# Patient Record
Sex: Female | Born: 1965 | Race: Asian | Hispanic: No | Smoking: Never smoker
Health system: Southern US, Community
[De-identification: ages and names within clinical notes are randomized; demographics above are authoritative.]

## PROBLEM LIST (undated history)

## (undated) DIAGNOSIS — C50919 Malignant neoplasm of unspecified site of unspecified female breast: Secondary | ICD-10-CM

## (undated) DIAGNOSIS — K259 Gastric ulcer, unspecified as acute or chronic, without hemorrhage or perforation: Principal | ICD-10-CM

## (undated) HISTORY — DX: Gastric ulcer, unspecified as acute or chronic, without hemorrhage or perforation: K25.9

## (undated) HISTORY — DX: Malignant neoplasm of unspecified site of unspecified female breast: C50.919

## (undated) HISTORY — PX: BREAST SURGERY: SHX581

---

## 2010-06-18 DIAGNOSIS — C50919 Malignant neoplasm of unspecified site of unspecified female breast: Secondary | ICD-10-CM

## 2010-06-18 HISTORY — PX: MASTECTOMY WITH AXILLARY LYMPH NODE DISSECTION: SHX5661

## 2010-06-18 HISTORY — DX: Malignant neoplasm of unspecified site of unspecified female breast: C50.919

## 2012-06-13 ENCOUNTER — Encounter: Payer: Self-pay | Admitting: *Deleted

## 2012-06-13 NOTE — Progress Notes (Signed)
Son came into today w/ paperwork to establish care for this mom since they have moved to the states.  I confirmed 07/04/12 appt w/ him.  Gave before appt letter & packet to him.  Took him to [facility] to get set up with some assistance.

## 2012-06-26 ENCOUNTER — Other Ambulatory Visit: Payer: Self-pay | Admitting: *Deleted

## 2012-06-26 DIAGNOSIS — C50911 Malignant neoplasm of unspecified site of right female breast: Secondary | ICD-10-CM | POA: Insufficient documentation

## 2012-06-26 DIAGNOSIS — C50919 Malignant neoplasm of unspecified site of unspecified female breast: Secondary | ICD-10-CM

## 2012-07-04 ENCOUNTER — Encounter: Payer: Self-pay | Admitting: Oncology

## 2012-07-04 ENCOUNTER — Other Ambulatory Visit (HOSPITAL_BASED_OUTPATIENT_CLINIC_OR_DEPARTMENT_OTHER): Payer: Self-pay | Admitting: Lab

## 2012-07-04 ENCOUNTER — Encounter (HOSPITAL_COMMUNITY): Payer: Self-pay | Admitting: *Deleted

## 2012-07-04 ENCOUNTER — Emergency Department (INDEPENDENT_AMBULATORY_CARE_PROVIDER_SITE_OTHER)
Admission: EM | Admit: 2012-07-04 | Discharge: 2012-07-04 | Disposition: A | Discharge status: Home / Self Care | Payer: Self-pay | Source: Home / Self Care | Attending: Emergency Medicine | Admitting: Emergency Medicine

## 2012-07-04 ENCOUNTER — Telehealth: Payer: Self-pay | Admitting: Oncology

## 2012-07-04 ENCOUNTER — Ambulatory Visit (HOSPITAL_BASED_OUTPATIENT_CLINIC_OR_DEPARTMENT_OTHER): Payer: Self-pay | Admitting: Oncology

## 2012-07-04 ENCOUNTER — Ambulatory Visit: Payer: Self-pay

## 2012-07-04 VITALS — BP 161/93 | HR 83 | Temp 98.1°F | Resp 20 | Ht 60.5 in | Wt 167.1 lb

## 2012-07-04 DIAGNOSIS — C50919 Malignant neoplasm of unspecified site of unspecified female breast: Secondary | ICD-10-CM

## 2012-07-04 DIAGNOSIS — L290 Pruritus ani: Secondary | ICD-10-CM

## 2012-07-04 DIAGNOSIS — Z170 Estrogen receptor positive status [ER+]: Secondary | ICD-10-CM

## 2012-07-04 DIAGNOSIS — K259 Gastric ulcer, unspecified as acute or chronic, without hemorrhage or perforation: Secondary | ICD-10-CM

## 2012-07-04 DIAGNOSIS — Z9010 Acquired absence of unspecified breast and nipple: Secondary | ICD-10-CM

## 2012-07-04 HISTORY — DX: Gastric ulcer, unspecified as acute or chronic, without hemorrhage or perforation: K25.9

## 2012-07-04 LAB — CBC WITH DIFFERENTIAL/PLATELET
BASO%: 0.6 % (ref 0.0–2.0)
Basophils Absolute: 0 10*3/uL (ref 0.0–0.1)
Eosinophils Absolute: 0.1 10*3/uL (ref 0.0–0.5)
HCT: 36.1 % (ref 34.8–46.6)
LYMPH%: 40.9 % (ref 14.0–49.7)
MCHC: 34.8 g/dL (ref 31.5–36.0)
MONO#: 0.5 10*3/uL (ref 0.1–0.9)
NEUT#: 2.1 10*3/uL (ref 1.5–6.5)
NEUT%: 44.3 % (ref 38.4–76.8)
Platelets: 153 10*3/uL (ref 145–400)
RBC: 4.09 10*6/uL (ref 3.70–5.45)
RDW: 12.3 % (ref 11.2–14.5)
WBC: 4.8 10*3/uL (ref 3.9–10.3)
lymph#: 2 10*3/uL (ref 0.9–3.3)

## 2012-07-04 LAB — COMPREHENSIVE METABOLIC PANEL (CC13)
ALT: 17 U/L (ref 0–55)
CO2: 26 mEq/L (ref 22–29)
Calcium: 9.3 mg/dL (ref 8.4–10.4)
Chloride: 107 mEq/L (ref 98–107)
Creatinine: 0.8 mg/dL (ref 0.6–1.1)
Glucose: 112 mg/dl — ABNORMAL HIGH (ref 70–99)
Potassium: 4 mEq/L (ref 3.5–5.1)
Sodium: 142 mEq/L (ref 136–145)
Total Protein: 7 g/dL (ref 6.4–8.3)

## 2012-07-04 LAB — CANCER ANTIGEN 27.29: CA 27.29: 29 U/mL (ref 0–39)

## 2012-07-04 MED ORDER — NYSTATIN 100000 UNIT/GM EX CREA: TOPICAL_CREAM | CUTANEOUS | Status: DC

## 2012-07-04 MED ORDER — HYDROCORTISONE 1 % EX CREA: TOPICAL_CREAM | CUTANEOUS | Status: DC

## 2012-07-04 NOTE — Progress Notes (Signed)
Checked in new pt with no insurance.  Pt is not a [place] citizen so is unable to apply for Medicaid.  Pt has turned in her completed EPP application so I will process it today and give the approval letter and green card to her after her appointment today.

## 2012-07-04 NOTE — Progress Notes (Signed)
Pt is approved for 100% financial assistance effective 07/04/12 - 01/01/13.  I will give her the approval letter and green card today after her appointment.

## 2012-07-04 NOTE — Progress Notes (Signed)
Angela [name] [id number] 07-08-1965 47 y.o. 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 Patient is establishing her oncologic care.  STAGE:  Stage III ( Invasive Ductal Carcinoma, ER+ Her2Neu+)  Right breast s/p mastectomy with ALND, 5.3 cm  REFERRING PHYSICIAN: Self referred  HISTORY OF PRESENT ILLNESS:  Angela [name] is a 47 y.o. female.  Was originally from [place]. Patient had her annual mammogram performed in November 2012 and she felt a mass. In 05/08/2011 patient underwent a right modified radical mastectomy with level II axillary clearance. Her final pathology revealed a invasive ductal carcinoma that was grade 3 measuring 5.3 cm in greatest dimension. All margins were negative. She was noted to have level lymphovascular invasion. Patient had 22 lymph nodes removed one was positive for metastatic disease without extracapsular extension.. Patient went on to receive adjuvant chemotherapy consisting of 6 cycles of Taxotere Adriamycin and Cytoxan. The tumor was ER positive PR positive HER-2/neu positive.She was then placed on tamoxifen 20 mg on a daily basis. She tolerated this quite well. She subsequently relocated to the [place] to be closer to her son.she is tolerating tamoxifen well without any complaints. She denies any nausea vomiting fevers chills night sweats headaches shortness of breath chest pains palpitations she has no myalgias and arthralgias. Remainder of the 14 point review of systems is negative.   Past Medical History: Past Medical History  Diagnosis Date  . Breast cancer 06/18/2010    stage II  . Stomach ulcer 07/04/2012    Past Surgical History: Past Surgical History  Procedure Date  . Mastectomy with axillary lymph node dissection 06/18/2010    T3N1    Family History: Family History  Problem Relation Age of Onset  .  Cancer Cousin 30    breast cancer    Social History History  Substance Use Topics  . Smoking status: Never Smoker   . Smokeless tobacco: Never Used  . Alcohol Use: No    Allergies: No Known Allergies  Current Medications: Current Outpatient Prescriptions  Medication Sig Dispense Refill  . Multiple Vitamins-Minerals (MULTIVITAMIN PO) Take 1 tablet by mouth daily.      . tamoxifen (NOLVADEX) 10 MG tablet Take 10 mg by mouth 2 (two) times daily.      . NON FORMULARY Risek 40mg  as needed for pain        OB/GYN History: menarche at 74, G2P1, menopause at 52, HRT,   Fertility Discussion: N/A Prior History of Cancer: N/A  Health Maintenance:  Colonoscopy none Bone Density none  Last PAP smear many years  ECOG PERFORMANCE STATUS: 0 - Asymptomatic  Genetic Counseling/testing: refer to genetic counseling and testing  REVIEW OF SYSTEMS:  A comprehensive review of systems was negative.  PHYSICAL EXAMINATION: Blood pressure 161/93, pulse 83, temperature 98.1 F (36.7 C), temperature source Oral, resp. rate 20, height 5' 0.5" (1.537 m), weight 167 lb 1.6 oz (75.796 kg).  NWG:NFAOZ, healthy, no distress, well nourished and well developed SKIN: skin color, texture, turgor are normal HEAD: Normocephalic EYES: PERRLA, EOMI EARS: External ears normal OROPHARYNX:no exudate and no erythema  NECK: supple, no adenopathy LYMPH:  no palpable lymphadenopathy, no hepatosplenomegaly BREAST:left breast normal without mass, skin or nipple changes or axillary nodes, surgical scars noted in the right mastectomy without any evidence of local recurrence. LUNGS: clear to auscultation  HEART: regular rate & rhythm ABDOMEN:abdomen soft, non-tender, normal bowel  sounds and no masses or organomegaly BACK: Back symmetric, no curvature., No CVA tenderness EXTREMITIES:no edema, no clubbing, no cyanosis  NEURO: alert & oriented x 3 with fluent speech, no focal motor/sensory deficits, gait  normal  STUDIES/RESULTS: No results found.  LABS:    Chemistry      Component Value Date/Time   NA 142 07/04/2012 0903   K 4.0 07/04/2012 0903   CL 107 07/04/2012 0903   CO2 26 07/04/2012 0903   BUN 13.2 07/04/2012 0903   CREATININE 0.8 07/04/2012 0903      Component Value Date/Time   CALCIUM 9.3 07/04/2012 0903   ALKPHOS 90 07/04/2012 0903   AST 18 07/04/2012 0903   ALT 17 07/04/2012 0903   BILITOT 0.43 07/04/2012 0903      Lab Results  Component Value Date   WBC 4.8 07/04/2012   HGB 12.6 07/04/2012   HCT 36.1 07/04/2012   MCV 88.3 07/04/2012   PLT 153 07/04/2012   PATHOLOGY: Invasive ductal carcinoma right breast 5.3 cm, ER+, PR+, Her2Neu +, grade III T3N2 (2/23)  ASSESSMENT    47 year old female with   #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 Her final pathology revealed a 5.3 cm ER positive PR positive HER-2/neu positive invasive ductal carcinoma. One out of 22 lymph nodes were positive for metastatic disease.  #2 patient went on to receive adjuvant chemotherapy consisting of 6 cycles of Taxotere Adriamycin and Cytoxan. Which she tolerated well overall.  #3 after completing the chemotherapy she started tamoxifen 20 mg daily she remains on this she is tolerating it well without any problems.  Clinical Trial Eligibility: no  Multidisciplinary conference discussion no      PLAN:    #1 patient will continue to get yearly mammograms on the left breast.  #2 we discussed genetics and the need for genetic counseling and testing and I will refer her to the genetic counselor.  #3 patient may eventually be able to switch to an aromatase inhibitor however we will plan on doing hormone levels on her next visit to demonstrate whether she is postmenopausal or not.  #4 patient will be seen back in about 3-6 months time for followup.        Discussion: Patient is being treated  per NCCN breast cancer care guidelines appropriate for stage III   Thank you so much for allowing me to participate in the care of Angela [name]. I will continue to follow up the patient with you and assist in her care.  All questions were answered. The patient knows to call the clinic with any problems, questions or concerns. We can certainly see the patient much sooner if necessary.  I spent 60 minutes counseling the patient face to face. The total time spent in the appointment was 60 minutes.  Drue Second, MD Medical/Oncology [facility] [phone] (beeper) [phone] (Office)  07/04/2012, 10:00 AM

## 2012-07-04 NOTE — Patient Instructions (Addendum)
Proceed with left mammogram   Continue tamoxifen daily  I will see you back in 3 months for follow up.  Call us with any problems at [phone]

## 2012-07-04 NOTE — ED Notes (Signed)
Pt  Reports   Symptoms  Of  Rectal  Itching      X  3  Months   denys  Any  Pain  Symptoms  Are  Not  releived  By prep  h   And   Or  Medicated  Wipes

## 2012-07-04 NOTE — ED Provider Notes (Addendum)
Chief Complaint  Patient presents with  . Pruritis    History of Present Illness:    Angela [name] is a 47 year old female who comes in today with her son and daughter. She is a recent immigrant [place] and speaks very limited [place]. Her family serve as her interpreters. They relate a history of anal itching which has been going on since she arrived in the [place]. They state this did not occur previously. She's only been in the [place] 3 or 4 months. She sometimes has a rash and swelling in that area. It seems to be worse after eating spicy foods or when exposed to cold. She's been using preparation H. and medicated wipes with slight improvement. She denies any hematochezia, melena, rectal pain, abdominal pain, constipation, diarrhea, nausea, or vomiting. She's never had a colonoscopy.  Review of Systems:  Other than noted above, the patient denies any of the following symptoms: Constitutional:  No fever, chills, fatigue, weight loss or anorexia. Lungs:  No cough or shortness of breath. Heart:  No chest pain, palpitations, syncope or edema.  No cardiac history. Abdomen:  No nausea, vomiting, hematememesis, melena, constipation, or diarrhea. GU:  No dysuria, frequency, urgency, or hematuria.   Skin:  No rash or itching.  PMFSH:  Past medical history, family history, social history, meds, and allergies were reviewed along with nurse's notes.  No prior abdominal surgeries or history of GI problems.  No use of NSAIDs or aspirin.  No excessive  alcohol intake.  Physical Exam:   Vital signs:  BP 149/79  Pulse 82  Temp 98.7 F (37.1 C) (Oral)  Resp 16  SpO2 98% Gen:  Alert, oriented, in no distress. Lungs:  Breath sounds clear and equal bilaterally.  No wheezes, rales or rhonchi. Heart:  Regular rhythm.  No gallops or murmers.   Abdomen:  Soft, flat, nondistended. No tenderness, guarding, or rebound. No organomegaly or mass. Bowel sounds are normally active. Skin:  Clear, warm and dry.  No rash.  Procedure  Note:  Verbal informed consent was obtained from the patient.  Risks and benefits were outlined with the patient.  Patient understands and accepts these risks.  Identity of the patient was confirmed verbally and by armband.    Procedure was performed as followed:  External exam was negative without any hemorrhoids or fissures. Anoscope was inserted to a depth of 4 cm and revealed no anal pathology including no hemorrhoids, fissures, or growths. Digital rectal exam reveals no pain on palpation, no masses, and heme-negative stool.  Patient tolerated the procedure well without any immediate complications.  Labs:   Results for orders placed during the hospital encounter of 07/04/12  OCCULT BLOOD, POC DEVICE      Component Value Range   Fecal Occult Bld NEGATIVE  NEGATIVE     Assessment:  The encounter diagnosis was Pruritus ani.  This could be due to dietary factors, contact dermatitis, dermatitis of uncertain cause, yeast or bacterial overgrowth.  Plan:   1.  The following meds were prescribed:   New Prescriptions   HYDROCORTISONE CREAM 1 %    Apply to affected area 3 times daily   NYSTATIN CREAM (MYCOSTATIN)    Apply to affected area 3 times daily   2.  The patient was instructed in symptomatic care and handouts were given. 3.  The patient was told to return if becoming worse in any way, if no better in 3 or 4 days, and given some red flag symptoms that would indicate  earlier return.  Follow up:  The patient was told to follow up with Dr. Erick Blinks if no improvement in 2 weeks.      Reuben Likes, MD 07/04/12 1406  Reuben Likes, MD 07/04/12 [phone]

## 2012-07-04 NOTE — Telephone Encounter (Signed)
gv pt son appt schedule for May and appt for mammo @ [facility] for 2/21. Per son pt speaks Persian/Dari but does not require an interpreter. Per son he will be with her at appts. BC aware.

## 2012-07-15 ENCOUNTER — Encounter: Payer: Self-pay | Admitting: *Deleted

## 2012-07-15 NOTE — Progress Notes (Signed)
Mailed after appt letter to pt. 

## 2012-07-28 ENCOUNTER — Ambulatory Visit
Admission: RE | Admit: 2012-07-28 | Discharge: 2012-07-28 | Disposition: A | Discharge status: Home / Self Care | Payer: Self-pay | Source: Ambulatory Visit | Attending: Oncology | Admitting: Oncology

## 2012-07-28 DIAGNOSIS — C50919 Malignant neoplasm of unspecified site of unspecified female breast: Secondary | ICD-10-CM

## 2012-07-31 ENCOUNTER — Telehealth: Payer: Self-pay | Admitting: *Deleted

## 2012-07-31 NOTE — Telephone Encounter (Signed)
Patient's son, Ramon Dredge Ghousuddin called for the report of hi mom's mammogram done today.  "I'm very upset right now and will be awaiting a call.  Says the mammogram department called him asking for previous mammogram reports because these reports were not transferred is why he's concerned.  He can be reached at [phone].  Will notify providers.

## 2012-10-05 ENCOUNTER — Ambulatory Visit (HOSPITAL_BASED_OUTPATIENT_CLINIC_OR_DEPARTMENT_OTHER): Payer: No Typology Code available for payment source | Admitting: Oncology

## 2012-10-05 ENCOUNTER — Encounter: Payer: Self-pay | Admitting: Oncology

## 2012-10-05 ENCOUNTER — Telehealth: Payer: Self-pay | Admitting: Oncology

## 2012-10-05 ENCOUNTER — Other Ambulatory Visit (HOSPITAL_BASED_OUTPATIENT_CLINIC_OR_DEPARTMENT_OTHER): Payer: No Typology Code available for payment source | Admitting: Lab

## 2012-10-05 VITALS — BP 166/104 | HR 84 | Temp 97.7°F | Resp 20 | Ht 60.5 in | Wt 177.6 lb

## 2012-10-05 DIAGNOSIS — C50919 Malignant neoplasm of unspecified site of unspecified female breast: Secondary | ICD-10-CM

## 2012-10-05 LAB — COMPREHENSIVE METABOLIC PANEL (CC13)
ALT: 23 U/L (ref 0–55)
AST: 24 U/L (ref 5–34)
Alkaline Phosphatase: 97 U/L (ref 40–150)
BUN: 12.6 mg/dL (ref 7.0–26.0)
Chloride: 109 mEq/L — ABNORMAL HIGH (ref 98–107)
Creatinine: 0.7 mg/dL (ref 0.6–1.1)
Total Bilirubin: 0.34 mg/dL (ref 0.20–1.20)

## 2012-10-05 LAB — CBC WITH DIFFERENTIAL/PLATELET
BASO%: 0.5 % (ref 0.0–2.0)
EOS%: 2.4 % (ref 0.0–7.0)
Eosinophils Absolute: 0.1 10*3/uL (ref 0.0–0.5)
MCHC: 34.6 g/dL (ref 31.5–36.0)
MCV: 88.7 fL (ref 79.5–101.0)
MONO%: 12.6 % (ref 0.0–14.0)
NEUT#: 2.9 10*3/uL (ref 1.5–6.5)
RBC: 4.02 10*6/uL (ref 3.70–5.45)
RDW: 12.7 % (ref 11.2–14.5)

## 2012-10-05 LAB — FOLLICLE STIMULATING HORMONE: FSH: 30.9 m[IU]/mL

## 2012-10-05 NOTE — Progress Notes (Signed)
OFFICE PROGRESS NOTE  CC  No primary provider on file. No primary provider on file.  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    STAGE:  Stage III ( Invasive Ductal Carcinoma, ER+ Her2Neu+)  Right breast s/p mastectomy with ALND, 5.3 cm    PRIOR THERAPY: #1originally from [place]. Patient had her annual mammogram performed in November 2012 and she felt a mass. In 05/08/2011 patient underwent a right modified radical mastectomy with level II axillary clearance. Her final pathology revealed a invasive ductal carcinoma that was grade 3 measuring 5.3 cm in greatest dimension. All margins were negative. She was noted to have level lymphovascular invasion. Patient had 22 lymph nodes removed one was positive for metastatic disease without extracapsular extension.. Patient went on to receive adjuvant chemotherapy consisting of 6 cycles of Taxotere Adriamycin and Cytoxan. The tumor was ER positive PR positive HER-2/neu positive.She was then placed on tamoxifen 20 mg on a daily basis. She tolerated this quite wel   CURRENT THERAPY:tamoxifen 20 mg daily  INTERVAL HISTORY: Angela [name] 47 y.o. female returns for followup visit today. She seems to be doing well without any problems she is accompanied by her son and her daughter-in-law. Patient denies any hot flashes fevers chills night sweats no shortness of breath chest pains or palpitations no myalgias and arthralgias remainder of the 10 point review of systems is negative.  MEDICAL HISTORY: Past Medical History  Diagnosis Date  . Breast cancer 06/18/2010    stage II  . Stomach ulcer 07/04/2012    ALLERGIES:  has No Known Allergies.  MEDICATIONS:  Current Outpatient Prescriptions  Medication Sig Dispense Refill  . hydrocortisone cream 1 % Apply topically 2 (two) times daily.      . Multiple Vitamins-Minerals (MULTIVITAMIN PO) Take 1 tablet by  mouth daily.      . NON FORMULARY Risek 40mg  as needed for pain      . tamoxifen (NOLVADEX) 10 MG tablet Take 10 mg by mouth 2 (two) times daily.      . hydrocortisone cream 1 % Apply to affected area 3 times daily  30 g  2  . nystatin cream (MYCOSTATIN) Apply to affected area 3 times daily  30 g  2   No current facility-administered medications for this visit.    SURGICAL HISTORY:  Past Surgical History  Procedure Laterality Date  . Mastectomy with axillary lymph node dissection  06/18/2010    T3N1    REVIEW OF SYSTEMS:  Pertinent items are noted in HPI.   HEALTH MAINTENANCE:  PHYSICAL EXAMINATION: Blood pressure 166/104, pulse 84, temperature 97.7 F (36.5 C), temperature source Oral, resp. rate 20, height 5' 0.5" (1.537 m), weight 177 lb 9.6 oz (80.559 kg). Body mass index is 34.1 kg/(m^2). ECOG PERFORMANCE STATUS: 0 - Asymptomatic  Patient is awake alert in no acute distress well-developed well-nourished female HEENT exam EOMI PERRLA sclerae is anicteric no conjunctival pallor oral mucosa is moist neck is supple lungs are clear bilaterally cardiovascular regular rate rhythm abdomen soft nontender no HSM extremities no edema neuro patient's alert oriented otherwise nonfocal left breast no masses nipple discharge right mastectomy no evidence of local recurrence  LABORATORY DATA: Lab Results  Component Value Date   WBC 5.5 10/05/2012   HGB 12.3 10/05/2012   HCT 35.7 10/05/2012   MCV 88.7 10/05/2012   PLT 164 10/05/2012      Chemistry  Component Value Date/Time   NA 141 10/05/2012 1128   K 4.1 10/05/2012 1128   CL 109* 10/05/2012 1128   CO2 24 10/05/2012 1128   BUN 12.6 10/05/2012 1128   CREATININE 0.7 10/05/2012 1128      Component Value Date/Time   CALCIUM 9.1 10/05/2012 1128   ALKPHOS 97 10/05/2012 1128   AST 24 10/05/2012 1128   ALT 23 10/05/2012 1128   BILITOT 0.34 10/05/2012 1128       RADIOGRAPHIC STUDIES:  No results found.  ASSESSMENT: 47 year old female with  #1 stage III  invasive ductal carcinoma ER positive HER-2/neu positive status post right mastectomy with axillary lymph node dissection for a 5.3 cm node-positive disease. Patient subsequently underwent adjuvant chemotherapy consisting of 6 cycles of Taxotere Adriamycin and Cytoxan. This was then followed by tamoxifen 20 mg daily. She has been on this and tolerating it very well.   PLAN:   #1 continue tamoxifen daily.  #2 mammograms are up to date.  #3 I will see her back in 6 months time for followup.   All questions were answered. The patient knows to call the clinic with any problems, questions or concerns. We can certainly see the patient much sooner if necessary.  I spent 15 minutes counseling the patient face to face. The total time spent in the appointment was 30 minutes.    Drue Second, MD Medical/Oncology [facility] [phone] (beeper) [phone] (Office)  10/05/2012, 1:06 PM

## 2012-10-05 NOTE — Patient Instructions (Addendum)
Doing well  We will continue tamoxifen 20 mg daily  I will see you back in 6 months

## 2012-10-17 LAB — ESTRADIOL, ULTRA SENS: Estradiol, Ultra Sensitive: 6 pg/mL

## 2012-12-18 ENCOUNTER — Other Ambulatory Visit: Payer: Self-pay | Admitting: Emergency Medicine

## 2012-12-18 MED ORDER — TAMOXIFEN CITRATE 10 MG PO TABS: 10.0000 mg | ORAL_TABLET | Freq: Two times a day (BID) | ORAL | Status: DC

## 2013-02-22 ENCOUNTER — Encounter (HOSPITAL_COMMUNITY): Payer: Self-pay | Admitting: *Deleted

## 2013-02-22 ENCOUNTER — Emergency Department (INDEPENDENT_AMBULATORY_CARE_PROVIDER_SITE_OTHER)
Admission: EM | Admit: 2013-02-22 | Discharge: 2013-02-22 | Disposition: A | Discharge status: Home / Self Care | Payer: No Typology Code available for payment source | Source: Home / Self Care | Attending: Emergency Medicine | Admitting: Emergency Medicine

## 2013-02-22 DIAGNOSIS — L290 Pruritus ani: Secondary | ICD-10-CM

## 2013-02-22 MED ORDER — PIMECROLIMUS 1 % EX CREA: TOPICAL_CREAM | Freq: Two times a day (BID) | CUTANEOUS | Status: DC

## 2013-02-22 NOTE — ED Provider Notes (Signed)
Chief Complaint:   Chief Complaint  Patient presents with  . Rectal Problems    History of Present Illness:    Angela [name] is a 47-year-old female, a refugee from the [place], who presents today with her son and daughter. She's been in the [place] for less than a year and speaks very limited [place]. The son and daughter-in-law service family interpreter. She was here 6-8 months ago with the same problem, namely anal itching. There is sometimes some swelling and redness. She denies any rectal bleeding, constipation, or diarrhea. She's had no bowel pain. The discomfort seems to be exacerbated by eating cold foods. She was given 1% hydrocortisone cream and nystatin cream, but neither these helped, only decreasing the itching by about 50%. She was given gastroenterologist to followup with, but never did followup. She has a history of breast cancer and stomach ulcer.  Review of Systems:  Other than noted above, the patient denies any of the following symptoms: Constitutional:  No fever, chills, fatigue, weight loss or anorexia. Lungs:  No cough or shortness of breath. Heart:  No chest pain, palpitations, syncope or edema.  No cardiac history. Abdomen:  No nausea, vomiting, hematememesis, melena, constipation, or diarrhea. GU:  No dysuria, frequency, urgency, or hematuria.   Skin:  No rash or itching.  PMFSH:  Past medical history, family history, social history, meds, and allergies were reviewed along with nurse's notes.  No prior abdominal surgeries or history of GI problems.  No use of NSAIDs or aspirin.  No excessive  alcohol intake.    Physical Exam:   Vital signs:  BP 120/60  Pulse 78  Temp(Src) 98.6 F (37 C) (Oral)  Resp 16  SpO2 100% Gen:  Alert, oriented, in no distress. Lungs:  Breath sounds clear and equal bilaterally.  No wheezes, rales or rhonchi. Heart:  Regular rhythm.  No gallops or murmers.   Abdomen:  Soft, nontender, no organomegaly or mass, bowel sounds were normal. Skin:   Clear, warm and dry.  No rash.  Procedure Note:  Verbal informed consent was obtained from the patient.  Risks and benefits were outlined with the patient.  Patient understands and accepts these risks.  Identity of the patient was confirmed verbally and by armband.    Procedure was performed as followed:  External exam was normal except for one small, noninflamed hemorrhoidal tag. There was no erythema and no ulcerations, rash, or external lesions noted. The endoscope was inserted to 1-2 inches and the anal mucosa appears normal with no hemorrhoids or fissures. Digital rectal exam reveals no masses, no tenderness, normal sphincter tone, and heme-negative stool.  Patient tolerated the procedure well without any immediate complications.  Assessment:  The encounter diagnosis was Pruritus ani.  Has idiopathic pruritus ani. She's not responded to corticosteroid antifungal creams thus far. We'll try Elidel and there is an over-the-counter cream that she can get online called Pranicure which she can get online at [email or web address].  Plan:   1.  Meds:  The following meds were prescribed:   Discharge Medication List as of 02/22/2013 12:30 PM    START taking these medications   Details  pimecrolimus (ELIDEL) 1 % cream Apply topically 2 (two) times daily., Starting 02/22/2013, Until Discontinued, Normal        2.  Patient Education/Counseling:  The patient was given appropriate handouts, self care instructions, and instructed in symptomatic relief.  I suggested proper diet and followup with gastroenterology.  3.  Follow up:  The patient  was told to follow up if no better in 3 to 4 days, if becoming worse in any way, and given some red flag symptoms such as any bleeding which would prompt immediate return.  Follow up with Dr. Erin Hearing for colonoscopy.      Reuben Likes, MD 02/22/13 [phone]

## 2013-02-22 NOTE — ED Notes (Signed)
Pt  Reports  Symptoms  Of rectal irritation  /  Itching        For  Several  Days       Has  History  Of  Similar  Symptoms

## 2013-04-09 ENCOUNTER — Encounter (INDEPENDENT_AMBULATORY_CARE_PROVIDER_SITE_OTHER): Payer: Self-pay

## 2013-04-09 ENCOUNTER — Telehealth: Payer: Self-pay | Admitting: *Deleted

## 2013-04-09 ENCOUNTER — Ambulatory Visit (HOSPITAL_BASED_OUTPATIENT_CLINIC_OR_DEPARTMENT_OTHER): Payer: No Typology Code available for payment source | Admitting: Oncology

## 2013-04-09 ENCOUNTER — Other Ambulatory Visit (HOSPITAL_BASED_OUTPATIENT_CLINIC_OR_DEPARTMENT_OTHER): Payer: No Typology Code available for payment source | Admitting: Lab

## 2013-04-09 ENCOUNTER — Encounter: Payer: Self-pay | Admitting: Oncology

## 2013-04-09 VITALS — BP 128/85 | HR 103 | Temp 97.4°F | Resp 20 | Ht 60.5 in | Wt 181.2 lb

## 2013-04-09 DIAGNOSIS — L290 Pruritus ani: Secondary | ICD-10-CM

## 2013-04-09 DIAGNOSIS — C773 Secondary and unspecified malignant neoplasm of axilla and upper limb lymph nodes: Secondary | ICD-10-CM

## 2013-04-09 DIAGNOSIS — C50919 Malignant neoplasm of unspecified site of unspecified female breast: Secondary | ICD-10-CM

## 2013-04-09 DIAGNOSIS — Z170 Estrogen receptor positive status [ER+]: Secondary | ICD-10-CM

## 2013-04-09 LAB — CBC WITH DIFFERENTIAL/PLATELET
Basophils Absolute: 0 10*3/uL (ref 0.0–0.1)
EOS%: 1.9 % (ref 0.0–7.0)
Eosinophils Absolute: 0.1 10*3/uL (ref 0.0–0.5)
HGB: 12.4 g/dL (ref 11.6–15.9)
NEUT#: 3.2 10*3/uL (ref 1.5–6.5)
RBC: 4.18 10*6/uL (ref 3.70–5.45)
RDW: 12.3 % (ref 11.2–14.5)
WBC: 5.6 10*3/uL (ref 3.9–10.3)
lymph#: 1.7 10*3/uL (ref 0.9–3.3)

## 2013-04-09 LAB — COMPREHENSIVE METABOLIC PANEL (CC13)
AST: 23 U/L (ref 5–34)
Albumin: 3.4 g/dL — ABNORMAL LOW (ref 3.5–5.0)
Alkaline Phosphatase: 75 U/L (ref 40–150)
BUN: 13.4 mg/dL (ref 7.0–26.0)
Calcium: 9.4 mg/dL (ref 8.4–10.4)
Chloride: 108 mEq/L (ref 98–109)
Glucose: 161 mg/dl — ABNORMAL HIGH (ref 70–140)
Potassium: 4 mEq/L (ref 3.5–5.1)
Sodium: 141 mEq/L (ref 136–145)
Total Protein: 6.8 g/dL (ref 6.4–8.3)

## 2013-04-09 MED ORDER — TAMOXIFEN CITRATE 20 MG PO TABS: 20.0000 mg | ORAL_TABLET | Freq: Every day | ORAL | Status: DC

## 2013-04-09 NOTE — Telephone Encounter (Signed)
appts made and printed...td 

## 2013-04-09 NOTE — Progress Notes (Signed)
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    STAGE:  Stage III ( Invasive Ductal Carcinoma, ER+ Her2Neu+)  Right breast s/p mastectomy with ALND, 5.3 cm    PRIOR THERAPY: #1originally from [place]. Patient had her annual mammogram performed in November 2012 and she felt a mass. In 05/08/2011 patient underwent a right modified radical mastectomy with level II axillary clearance. Her final pathology revealed a invasive ductal carcinoma that was grade 3 measuring 5.3 cm in greatest dimension. All margins were negative. She was noted to have level lymphovascular invasion. Patient had 22 lymph nodes removed one was positive for metastatic disease without extracapsular extension.. Patient went on to receive adjuvant chemotherapy consisting of 6 cycles of Taxotere Adriamycin and Cytoxan. The tumor was ER positive PR positive HER-2/neu positive.She was then placed on tamoxifen 20 mg on a daily basis. She tolerated this quite wel   CURRENT THERAPY:tamoxifen 20 mg daily  INTERVAL HISTORY: Angela [name] 47 y.o. female returns for followup visit today. Overall she's doing well. She has gained some weight. Her son is concerned about this. She is tolerating tamoxifen without any significant problems. She does have some anal pruritus. She was seen at an urgent care and was found to have some hemorrhoids. Her son is planning on taking her to the gastroenterologist for further evaluation. Patient denies any nausea vomiting fevers chills night sweats headaches no shortness of breath no chest pains palpitations. Remainder of the 10 point review of systems is negative.  MEDICAL HISTORY: Past Medical History  Diagnosis Date  . Breast cancer 06/18/2010    stage II  . Stomach ulcer 07/04/2012    ALLERGIES:  has No Known  Allergies.  MEDICATIONS:  Current Outpatient Prescriptions  Medication Sig Dispense Refill  . fish oil-omega-3 fatty acids 1000 MG capsule Take 2 g by mouth daily.      . Multiple Vitamins-Minerals (MULTIVITAMIN PO) Take 1 tablet by mouth daily.      . tamoxifen (NOLVADEX) 10 MG tablet Take 1 tablet (10 mg total) by mouth 2 (two) times daily.  60 tablet  3  . hydrocortisone cream 1 % Apply topically 2 (two) times daily.      . hydrocortisone cream 1 % Apply to affected area 3 times daily  30 g  2  . NON FORMULARY Risek 40mg  as needed for pain      . nystatin cream (MYCOSTATIN) Apply to affected area 3 times daily  30 g  2  . pimecrolimus (ELIDEL) 1 % cream Apply topically 2 (two) times daily.  30 g  5   No current facility-administered medications for this visit.    SURGICAL HISTORY:  Past Surgical History  Procedure Laterality Date  . Mastectomy with axillary lymph node dissection  06/18/2010    T3N1    REVIEW OF SYSTEMS:  Pertinent items are noted in HPI.   HEALTH MAINTENANCE:  PHYSICAL EXAMINATION: Blood pressure 128/85, pulse 103, temperature 97.4 F (36.3 C), temperature source Oral, resp. rate 20, height 5' 0.5" (1.537 m), weight 181 lb 3.2 oz (82.192 kg). Body mass index is 34.79 kg/(m^2). ECOG PERFORMANCE STATUS: 0 - Asymptomatic  Patient is awake alert in no acute distress well-developed well-nourished female HEENT exam EOMI PERRLA sclerae is anicteric no conjunctival pallor oral mucosa is moist neck is supple lungs are clear  bilaterally cardiovascular regular rate rhythm abdomen soft nontender no HSM extremities no edema neuro patient's alert oriented otherwise nonfocal left breast no masses nipple discharge right mastectomy no evidence of local recurrence  LABORATORY DATA: Lab Results  Component Value Date   WBC 5.6 04/09/2013   HGB 12.4 04/09/2013   HCT 37.1 04/09/2013   MCV 88.7 04/09/2013   PLT 197 04/09/2013      Chemistry      Component Value Date/Time   NA  141 10/05/2012 1128   K 4.1 10/05/2012 1128   CL 109* 10/05/2012 1128   CO2 24 10/05/2012 1128   BUN 12.6 10/05/2012 1128   CREATININE 0.7 10/05/2012 1128      Component Value Date/Time   CALCIUM 9.1 10/05/2012 1128   ALKPHOS 97 10/05/2012 1128   AST 24 10/05/2012 1128   ALT 23 10/05/2012 1128   BILITOT 0.34 10/05/2012 1128       RADIOGRAPHIC STUDIES:  No results found.  ASSESSMENT: 47 year old female with  #1 stage III invasive ductal carcinoma ER positive HER-2/neu positive status post right mastectomy with axillary lymph node dissection for a 5.3 cm node-positive disease. Patient subsequently underwent adjuvant chemotherapy consisting of 6 cycles of Taxotere Adriamycin and Cytoxan. This was then followed by tamoxifen 20 mg daily. She has been on this and tolerating it very well. Patient has no evidence of recurrent disease  2 anal pruritus  #3 weight gain: I have recommended patient increase her activity to reduce carbohydrates.   PLAN:  #1 patient will continue tamoxifen 20 mg daily.  #2 I have recommended that she be seen by gastroenterology for further evaluation. Her son will make arrangements for this.  All questions were answered. The patient knows to call the clinic with any problems, questions or concerns. We can certainly see the patient much sooner if necessary.  I spent 15 minutes counseling the patient face to face. The total time spent in the appointment was 30 minutes.    Drue Second, MD Medical/Oncology [facility] [phone] (beeper) [phone] (Office)  04/09/2013, 8:53 AM

## 2013-04-09 NOTE — Patient Instructions (Signed)
Doing well continue tamxifen 20 mg daily  We will see you back in 8 months

## 2013-04-09 NOTE — Progress Notes (Signed)
Son came in inquiring about application. She has appt today and Darlena advised him once billed make sure to call and let them know I will send note to billing also.  I cut up the old green card.

## 2013-10-24 ENCOUNTER — Encounter: Payer: Self-pay | Admitting: Oncology

## 2013-10-24 NOTE — Progress Notes (Signed)
Pt's son called back with a change of address.  I updated pt's chart.

## 2013-10-24 NOTE — Progress Notes (Signed)
Returned pt's phone call.  Requested her to return my phone call.

## 2013-11-27 ENCOUNTER — Telehealth: Payer: Self-pay | Admitting: Hematology and Oncology

## 2013-11-27 NOTE — Telephone Encounter (Signed)
, °

## 2013-12-05 ENCOUNTER — Telehealth: Payer: Self-pay | Admitting: Physician Assistant

## 2013-12-05 ENCOUNTER — Telehealth: Payer: Self-pay

## 2013-12-05 NOTE — Telephone Encounter (Signed)
per pof to sch appt-per Teri(nurse) pt has been notified

## 2013-12-05 NOTE — Telephone Encounter (Signed)
Returned pt son call re: rescheduling mother's follow up visit.  Pt to be seen in follow up 8 months - would put her in July.  Pt was scheduled for [place] in September.  Pt son unwilling to wait until Sept, states "this is not the flu".  Rescheduled pt to July 10, 945 lab and 1015 appt with APP.  Pt voiced understanding.  POF entered and scheduling notified.

## 2013-12-07 ENCOUNTER — Ambulatory Visit: Payer: No Typology Code available for payment source | Admitting: Oncology

## 2013-12-07 ENCOUNTER — Other Ambulatory Visit: Payer: No Typology Code available for payment source

## 2013-12-14 ENCOUNTER — Telehealth: Payer: Self-pay | Admitting: Hematology and Oncology

## 2013-12-14 ENCOUNTER — Other Ambulatory Visit (HOSPITAL_BASED_OUTPATIENT_CLINIC_OR_DEPARTMENT_OTHER): Payer: No Typology Code available for payment source

## 2013-12-14 ENCOUNTER — Ambulatory Visit (HOSPITAL_BASED_OUTPATIENT_CLINIC_OR_DEPARTMENT_OTHER): Payer: No Typology Code available for payment source | Admitting: Physician Assistant

## 2013-12-14 VITALS — BP 149/86 | HR 73 | Temp 97.6°F | Resp 18 | Ht 60.0 in | Wt 178.0 lb

## 2013-12-14 DIAGNOSIS — C50919 Malignant neoplasm of unspecified site of unspecified female breast: Secondary | ICD-10-CM

## 2013-12-14 DIAGNOSIS — C773 Secondary and unspecified malignant neoplasm of axilla and upper limb lymph nodes: Secondary | ICD-10-CM

## 2013-12-14 DIAGNOSIS — L298 Other pruritus: Secondary | ICD-10-CM

## 2013-12-14 DIAGNOSIS — Z170 Estrogen receptor positive status [ER+]: Secondary | ICD-10-CM

## 2013-12-14 LAB — CBC WITH DIFFERENTIAL/PLATELET
BASO%: 0.7 % (ref 0.0–2.0)
BASOS ABS: 0 10*3/uL (ref 0.0–0.1)
EOS%: 2.5 % (ref 0.0–7.0)
Eosinophils Absolute: 0.1 10*3/uL (ref 0.0–0.5)
HEMATOCRIT: 37.2 % (ref 34.8–46.6)
HEMOGLOBIN: 12.7 g/dL (ref 11.6–15.9)
LYMPH#: 1.7 10*3/uL (ref 0.9–3.3)
LYMPH%: 33.1 % (ref 14.0–49.7)
MCH: 30.2 pg (ref 25.1–34.0)
MCHC: 34 g/dL (ref 31.5–36.0)
MCV: 88.8 fL (ref 79.5–101.0)
MONO#: 0.6 10*3/uL (ref 0.1–0.9)
MONO%: 10.7 % (ref 0.0–14.0)
NEUT#: 2.8 10*3/uL (ref 1.5–6.5)
NEUT%: 53 % (ref 38.4–76.8)
Platelets: 193 10*3/uL (ref 145–400)
RBC: 4.19 10*6/uL (ref 3.70–5.45)
RDW: 12.2 % (ref 11.2–14.5)
WBC: 5.2 10*3/uL (ref 3.9–10.3)

## 2013-12-14 LAB — COMPREHENSIVE METABOLIC PANEL (CC13)
ALT: 19 U/L (ref 0–55)
ANION GAP: 9 meq/L (ref 3–11)
AST: 20 U/L (ref 5–34)
Albumin: 3.8 g/dL (ref 3.5–5.0)
Alkaline Phosphatase: 78 U/L (ref 40–150)
BUN: 10.2 mg/dL (ref 7.0–26.0)
CALCIUM: 9.5 mg/dL (ref 8.4–10.4)
CHLORIDE: 108 meq/L (ref 98–109)
CO2: 25 meq/L (ref 22–29)
CREATININE: 0.7 mg/dL (ref 0.6–1.1)
GLUCOSE: 114 mg/dL (ref 70–140)
Potassium: 4.3 mEq/L (ref 3.5–5.1)
Sodium: 142 mEq/L (ref 136–145)
Total Bilirubin: 0.49 mg/dL (ref 0.20–1.20)
Total Protein: 6.9 g/dL (ref 6.4–8.3)

## 2013-12-14 NOTE — Telephone Encounter (Signed)
Gave pt appt for lab and MD for January with Delos Haring

## 2013-12-16 ENCOUNTER — Encounter: Payer: Self-pay | Admitting: Physician Assistant

## 2013-12-16 NOTE — Progress Notes (Signed)
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    STAGE:  Stage III ( Invasive Ductal Carcinoma, ER+ Her2Neu+)  Right breast s/p mastectomy with ALND, 5.3 cm    PRIOR THERAPY: #1originally from [place]. Patient had her annual mammogram performed in November 2012 and she felt a mass. In 05/08/2011 patient underwent a right modified radical mastectomy with level II axillary clearance. Her final pathology revealed a invasive ductal carcinoma that was grade 3 measuring 5.3 cm in greatest dimension. All margins were negative. She was noted to have level lymphovascular invasion. Patient had 22 lymph nodes removed one was positive for metastatic disease without extracapsular extension.. Patient went on to receive adjuvant chemotherapy consisting of 6 cycles of Taxotere Adriamycin and Cytoxan. The tumor was ER positive PR positive HER-2/neu positive.She was then placed on tamoxifen 20 mg on a daily basis. She tolerated this quite wel   CURRENT THERAPY:tamoxifen 20 mg daily  INTERVAL HISTORY: Angela [name] 48 y.o. female returns for followup visit today. She is accompanied today by her son F'nu and his [place] who both act as interpreters for Mrs. Greiner.  She is tolerating tamoxifen without any significant problems. Patient denies any nausea vomiting fevers chills night sweats headaches no shortness of breath no chest pains palpitations. Remainder of the 10 point review of systems is negative. She is overdue for her annual mammogram of the left breast, last mammogram performed 07/31/2012. Of note the patient does not have a right breast prosthesis or corresponding mastectomy bras to accommodate a breast prosthesis. She is currently starting the right breast with cotton balls.  MEDICAL HISTORY: Past Medical History  Diagnosis Date  .  Breast cancer 06/18/2010    stage II  . Stomach ulcer 07/04/2012    ALLERGIES:  has No Known Allergies.  MEDICATIONS:  Current Outpatient Prescriptions  Medication Sig Dispense Refill  . fish oil-omega-3 fatty acids 1000 MG capsule Take 2 g by mouth daily.      . hydrocortisone cream 1 % Apply topically 2 (two) times daily.      . hydrocortisone cream 1 % Apply to affected area 3 times daily  30 g  2  . Multiple Vitamins-Minerals (MULTIVITAMIN PO) Take 1 tablet by mouth daily.      . NON FORMULARY Risek 35m as needed for pain      . tamoxifen (NOLVADEX) 20 MG tablet Take 1 tablet (20 mg total) by mouth daily.  30 tablet  8  . nystatin cream (MYCOSTATIN) Apply to affected area 3 times daily  30 g  2  . pimecrolimus (ELIDEL) 1 % cream Apply topically 2 (two) times daily.  30 g  5   No current facility-administered medications for this visit.    SURGICAL HISTORY:  Past Surgical History  Procedure Laterality Date  . Mastectomy with axillary lymph node dissection  06/18/2010    T3N1    REVIEW OF SYSTEMS:  Pertinent items are noted in HPI.   HEALTH MAINTENANCE:  PHYSICAL EXAMINATION: Blood pressure 149/86, pulse 73, temperature 97.6 F (36.4 C), temperature source Oral, resp. rate 18, height 5' (1.524 m), weight 178 lb (80.74 kg), SpO2 100.00%. Body mass index is 34.76 kg/(m^2).  ECOG PERFORMANCE STATUS: 0 - Asymptomatic  PHYSICAL EXAMINATION: General appearance: alert, cooperative, appears stated age and no distress Head: Normocephalic, without obvious abnormality, atraumatic Neck:  no adenopathy, no carotid bruit, no JVD, supple, symmetrical, trachea midline and thyroid not enlarged, symmetric, no tenderness/mass/nodules Lymph nodes: Cervical, supraclavicular, and axillary nodes normal. Resp: clear to auscultation bilaterally Back: symmetric, no curvature. ROM normal. No CVA tenderness. Cardio: regular rate and rhythm, S1, S2 normal, no murmur, click, rub or gallop GI: soft,  non-tender; bowel sounds normal; no masses,  no organomegaly Extremities: extremities normal, atraumatic, no cyanosis or edema Neurologic: Alert and oriented X 3, normal strength and tone. Normal symmetric reflexes. Normal coordination and gait Breasts: Right mastectomy, incision is well-healed without evidence of skin changes or ocal recurrence. Left breast without any palpable masses, skin changes of concern nipple changes or nipple discharge. Nothing palpable in either axilla.   LABORATORY DATA: Lab Results  Component Value Date   WBC 5.2 12/14/2013   HGB 12.7 12/14/2013   HCT 37.2 12/14/2013   MCV 88.8 12/14/2013   PLT 193 12/14/2013      Chemistry      Component Value Date/Time   NA 142 12/14/2013 0924   K 4.3 12/14/2013 0924   CL 109* 10/05/2012 1128   CO2 25 12/14/2013 0924   BUN 10.2 12/14/2013 0924   CREATININE 0.7 12/14/2013 0924      Component Value Date/Time   CALCIUM 9.5 12/14/2013 0924   ALKPHOS 78 12/14/2013 0924   AST 20 12/14/2013 0924   ALT 19 12/14/2013 0924   BILITOT 0.49 12/14/2013 0924       RADIOGRAPHIC STUDIES:  No results found.  ASSESSMENT: 49 year old female with  #1 stage III invasive ductal carcinoma ER positive HER-2/neu positive status post right mastectomy with axillary lymph node dissection for a 5.3 cm node-positive disease. Patient subsequently underwent adjuvant chemotherapy consisting of 6 cycles of Taxotere Adriamycin and Cytoxan. This was then followed by tamoxifen 20 mg daily. She has been on this and tolerating it very well. Patient has no evidence of recurrent disease  2 anal pruritus  #3 weight gain: I have recommended patient increase her activity to reduce carbohydrates.   PLAN:  #1 patient will continue tamoxifen 20 mg daily. 90 day supply of her tamoxifen with 1 refill was sent to her pharmacy of record via E. scribed.  #2 as scheduled a screening mammogram of the left breast hopefully it to be done within the next month or so severely  to get her back on track. Additionally we will will look to helping her get a right breast prosthesis and adequate bras to accommodate the prosthesis.   The patient and her family members have expressed concern regarding of assignment of medical oncologist. She would prefer to be seen by a female. The lower expressed a comfort level with me and I will see him in followup in 6 months. At that time hopefully we will have a female breast oncologist on staff for me to get her plugged into. Patient and her family members are in agreement with this plan.   All questions were answered. The patient knows to call the clinic with any problems, questions or concerns. We can certainly see the patient much sooner if necessary.  I spent 25 minutes counseling the patient face to face. The total time spent in the appointment was 35 minutes.   Sampson Si Medical/Oncology [place] [phone] (Office)  12/16/2013, 4:46 PM

## 2013-12-16 NOTE — Patient Instructions (Signed)
Continue tamoxifen 20 mg by mouth daily. Keep the appointment for your left breast mammogram Follow up in 6 months

## 2014-01-02 ENCOUNTER — Ambulatory Visit: Payer: No Typology Code available for payment source

## 2014-01-08 ENCOUNTER — Ambulatory Visit
Admission: RE | Admit: 2014-01-08 | Discharge: 2014-01-08 | Disposition: A | Discharge status: Home / Self Care | Payer: No Typology Code available for payment source | Source: Ambulatory Visit | Attending: Physician Assistant | Admitting: Physician Assistant

## 2014-01-08 DIAGNOSIS — C50919 Malignant neoplasm of unspecified site of unspecified female breast: Secondary | ICD-10-CM

## 2014-02-27 ENCOUNTER — Ambulatory Visit: Payer: No Typology Code available for payment source | Admitting: Hematology and Oncology

## 2014-02-27 ENCOUNTER — Other Ambulatory Visit: Payer: No Typology Code available for payment source

## 2014-03-11 ENCOUNTER — Telehealth: Payer: Self-pay

## 2014-03-11 ENCOUNTER — Other Ambulatory Visit: Payer: Self-pay | Admitting: Oncology

## 2014-03-11 DIAGNOSIS — C50919 Malignant neoplasm of unspecified site of unspecified female breast: Secondary | ICD-10-CM

## 2014-03-11 NOTE — Telephone Encounter (Signed)
Let pt daughter know refill for tamoxifen had been called in.  Pt requested adequate refills until next office visit.  Let pt know I would take care of. Spoke with [facility] pharmacy and added refills to existing prescription adequate until next ofc visit in Jan.

## 2014-06-17 ENCOUNTER — Other Ambulatory Visit (HOSPITAL_BASED_OUTPATIENT_CLINIC_OR_DEPARTMENT_OTHER): Payer: Self-pay

## 2014-06-17 ENCOUNTER — Encounter: Payer: Self-pay | Admitting: Physician Assistant

## 2014-06-17 ENCOUNTER — Telehealth: Payer: Self-pay | Admitting: Physician Assistant

## 2014-06-17 ENCOUNTER — Ambulatory Visit (HOSPITAL_BASED_OUTPATIENT_CLINIC_OR_DEPARTMENT_OTHER): Payer: Self-pay | Admitting: Physician Assistant

## 2014-06-17 VITALS — BP 149/86 | HR 81 | Temp 97.7°F | Resp 17 | Ht 60.0 in | Wt 176.7 lb

## 2014-06-17 DIAGNOSIS — C773 Secondary and unspecified malignant neoplasm of axilla and upper limb lymph nodes: Secondary | ICD-10-CM

## 2014-06-17 DIAGNOSIS — C50919 Malignant neoplasm of unspecified site of unspecified female breast: Secondary | ICD-10-CM

## 2014-06-17 DIAGNOSIS — Z170 Estrogen receptor positive status [ER+]: Secondary | ICD-10-CM

## 2014-06-17 DIAGNOSIS — C50911 Malignant neoplasm of unspecified site of right female breast: Secondary | ICD-10-CM

## 2014-06-17 LAB — COMPREHENSIVE METABOLIC PANEL (CC13)
ALK PHOS: 79 U/L (ref 40–150)
ALT: 16 U/L (ref 0–55)
ANION GAP: 8 meq/L (ref 3–11)
AST: 21 U/L (ref 5–34)
Albumin: 3.8 g/dL (ref 3.5–5.0)
BILIRUBIN TOTAL: 0.48 mg/dL (ref 0.20–1.20)
BUN: 14.4 mg/dL (ref 7.0–26.0)
CALCIUM: 9.1 mg/dL (ref 8.4–10.4)
CHLORIDE: 108 meq/L (ref 98–109)
CO2: 25 meq/L (ref 22–29)
CREATININE: 0.7 mg/dL (ref 0.6–1.1)
GLUCOSE: 107 mg/dL (ref 70–140)
POTASSIUM: 4 meq/L (ref 3.5–5.1)
SODIUM: 142 meq/L (ref 136–145)
TOTAL PROTEIN: 6.7 g/dL (ref 6.4–8.3)

## 2014-06-17 LAB — CBC WITH DIFFERENTIAL/PLATELET
BASO%: 0.6 % (ref 0.0–2.0)
Basophils Absolute: 0 10*3/uL (ref 0.0–0.1)
EOS ABS: 0.1 10*3/uL (ref 0.0–0.5)
EOS%: 2.3 % (ref 0.0–7.0)
HEMATOCRIT: 37.4 % (ref 34.8–46.6)
HGB: 12.6 g/dL (ref 11.6–15.9)
LYMPH%: 27.7 % (ref 14.0–49.7)
MCH: 30.1 pg (ref 25.1–34.0)
MCHC: 33.7 g/dL (ref 31.5–36.0)
MCV: 89.4 fL (ref 79.5–101.0)
MONO#: 0.5 10*3/uL (ref 0.1–0.9)
MONO%: 9.1 % (ref 0.0–14.0)
NEUT%: 60.3 % (ref 38.4–76.8)
NEUTROS ABS: 3.6 10*3/uL (ref 1.5–6.5)
PLATELETS: 177 10*3/uL (ref 145–400)
RBC: 4.18 10*6/uL (ref 3.70–5.45)
RDW: 12.6 % (ref 11.2–14.5)
WBC: 6 10*3/uL (ref 3.9–10.3)
lymph#: 1.7 10*3/uL (ref 0.9–3.3)

## 2014-06-17 MED ORDER — TAMOXIFEN CITRATE 20 MG PO TABS: 20.0000 mg | ORAL_TABLET | Freq: Every day | ORAL | Status: DC

## 2014-06-17 NOTE — Patient Instructions (Signed)
Continue tamoxifen 20 mg by mouth daily Follow-up in 6 months or sooner if needed

## 2014-06-17 NOTE — Progress Notes (Signed)
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    STAGE:  Stage III ( Invasive Ductal Carcinoma, ER+ Her2Neu+)  Right breast s/p mastectomy with ALND, 5.3 cm    PRIOR THERAPY: #1originally from [place]. Patient had her annual mammogram performed in November 2012 and she felt a mass. In 05/08/2011 patient underwent a right modified radical mastectomy with level II axillary clearance. Her final pathology revealed a invasive ductal carcinoma that was grade 3 measuring 5.3 cm in greatest dimension. All margins were negative. She was noted to have level lymphovascular invasion. Patient had 22 lymph nodes removed one was positive for metastatic disease without extracapsular extension.. Patient went on to receive adjuvant chemotherapy consisting of 6 cycles of Taxotere Adriamycin and Cytoxan. The tumor was ER positive PR positive HER-2/neu positive.She was then placed on tamoxifen 20 mg on a daily basis. She tolerated this quite wel   CURRENT THERAPY: tamoxifen 20 mg daily since 2013  INTERVAL HISTORY: Angela [name] 49 y.o. female returns for followup visit today. She is accompanied today by her son Angela [name] and his [place] who both act as interpreters for Angela [name].  She is tolerating tamoxifen without any significant problems. Patient denies any nausea vomiting fevers chills night sweats headaches no shortness of breath no chest pains palpitations. Remainder of the 10 point review of systems is negative. She is overdue for her annual mammogram of the left breast, last mammogram performed 01/08/2014.  Shew requests a refill for tamoxifen. She would like a 90 day supply as this is easier for her son and future daughter-in-law to pick up as they both work.   MEDICAL HISTORY: Past Medical History  Diagnosis Date  . Breast cancer 06/18/2010    stage II  . Stomach ulcer 07/04/2012    ALLERGIES:  has No Known Allergies.  MEDICATIONS:  Current Outpatient Prescriptions  Medication Sig Dispense Refill  . Multiple Vitamins-Minerals (MULTIVITAMIN PO) Take 1 tablet by mouth daily.    . NON FORMULARY Risek 54m as needed for pain    . tamoxifen (NOLVADEX) 20 MG tablet Take 1 tablet (20 mg total) by mouth daily. 90 tablet 1  . fish oil-omega-3 fatty acids 1000 MG capsule Take 2 g by mouth daily.    . hydrocortisone cream 1 % Apply topically 2 (two) times daily.    . hydrocortisone cream 1 % Apply to affected area 3 times daily (Patient not taking: Reported on 06/17/2014) 30 g 2  . nystatin cream (MYCOSTATIN) Apply to affected area 3 times daily (Patient not taking: Reported on 06/17/2014) 30 g 2  . pimecrolimus (ELIDEL) 1 % cream Apply topically 2 (two) times daily. (Patient not taking: Reported on 06/17/2014) 30 g 5   No current facility-administered medications for this visit.    SURGICAL HISTORY:  Past Surgical History  Procedure Laterality Date  . Mastectomy with axillary lymph node dissection  06/18/2010    T3N1    REVIEW OF SYSTEMS:  Pertinent items are noted in HPI.   HEALTH MAINTENANCE: Last mammogram 01/08/2014. There was no mammographic evidence of malignancy. Recommendation for screening mammogram in 1 year  PHYSICAL EXAMINATION: Blood pressure 149/86, pulse 81, temperature 97.7 F (36.5 C), temperature source Oral, resp. rate 17, height 5' (1.524 m), weight 176 lb 11.2 oz (80.151 kg), SpO2 96 %. Body mass index is 34.51 kg/(m^2).  ECOG PERFORMANCE STATUS: 0 - Asymptomatic  PHYSICAL EXAMINATION: General appearance: alert, cooperative, appears stated age and no distress Head: Normocephalic, without obvious abnormality, atraumatic Neck: no adenopathy, no carotid bruit, no JVD, supple, symmetrical, trachea midline and thyroid not enlarged, symmetric, no tenderness/mass/nodules Lymph nodes: Cervical, supraclavicular, and  axillary nodes normal. Resp: clear to auscultation bilaterally Back: symmetric, no curvature. ROM normal. No CVA tenderness. Cardio: regular rate and rhythm, S1, S2 normal, no murmur, click, rub or gallop GI: soft, non-tender; bowel sounds normal; no masses,  no organomegaly Extremities: extremities normal, atraumatic, no cyanosis or edema Neurologic: Alert and oriented X 3, normal strength and tone. Normal symmetric reflexes. Normal coordination and gait Breasts: Right mastectomy, incision is well-healed without evidence of skin changes or local recurrence. Left breast without any palpable masses, skin changes of concern nipple changes or nipple discharge. Nothing palpable in either axilla.   LABORATORY DATA: Lab Results  Component Value Date   WBC 6.0 06/17/2014   HGB 12.6 06/17/2014   HCT 37.4 06/17/2014   MCV 89.4 06/17/2014   PLT 177 06/17/2014      Chemistry      Component Value Date/Time   NA 142 06/17/2014 0947   K 4.0 06/17/2014 0947   CL 109* 10/05/2012 1128   CO2 25 06/17/2014 0947   BUN 14.4 06/17/2014 0947   CREATININE 0.7 06/17/2014 0947      Component Value Date/Time   CALCIUM 9.1 06/17/2014 0947   ALKPHOS 79 06/17/2014 0947   AST 21 06/17/2014 0947   ALT 16 06/17/2014 0947   BILITOT 0.48 06/17/2014 0947       RADIOGRAPHIC STUDIES:  No results found.  ASSESSMENT: 49 year old female with  #1 stage III invasive ductal carcinoma ER positive HER-2/neu positive status post right mastectomy with axillary lymph node dissection for a 5.3 cm node-positive disease. Patient subsequently underwent adjuvant chemotherapy consisting of 6 cycles of Taxotere Adriamycin and Cytoxan. This was then followed by tamoxifen 20 mg daily. From her history this looks like the tamoxifen was started mid year 2013. She has been on this and tolerating it very well. Patient has no evidence of recurrent disease  2 anal pruritus this is episodic in nature and she uses either  over-the-counter hydrocortisone cream or Elidel     PLAN:  #1 patient will continue tamoxifen 20 mg daily. 90 day supply of her tamoxifen with 1 refill was sent to her pharmacy of record via E. scribed.  #2 patient had a screening mammogram performed 01/08/2014. She will be due for another screening mammogram in August 2016.   The patient and her family members have again expressed concern regarding of assignment of medical oncologist. She would prefer to be seen by a female. They have again expressed their comfort level and confidence in me as a practitioner however I have recommended that the patient be seen by Dr. Burr Medico in 6 months. At that visit Dr. Burr Medico can schedule the patient's next screening mammogram as well as discussed length of time to continue tamoxifen and any other treatment questions the patient and family members may have. I'm available to see the patient for work in visits if needed in the interim.  Patient and her family members are in agreement with this plan.   All questions were answered. The patient knows to call the clinic with any problems, questions or concerns. We can certainly see the patient much sooner if necessary.  I spent 25 minutes counseling the patient face to face. The  total time spent in the appointment was 35 minutes.   Sampson Si Medical/Oncology [place] [phone] (Office)  06/17/2014, 1:22 PM

## 2014-06-17 NOTE — Telephone Encounter (Signed)
Gave avs & cal for July °

## 2014-09-23 ENCOUNTER — Ambulatory Visit (INDEPENDENT_AMBULATORY_CARE_PROVIDER_SITE_OTHER): Payer: Self-pay | Admitting: Family Medicine

## 2014-09-23 VITALS — BP 130/82 | HR 89 | Temp 97.9°F | Resp 16 | Ht 61.0 in | Wt 180.0 lb

## 2014-09-23 DIAGNOSIS — Z136 Encounter for screening for cardiovascular disorders: Secondary | ICD-10-CM

## 2014-09-23 DIAGNOSIS — Z758 Other problems related to medical facilities and other health care: Secondary | ICD-10-CM

## 2014-09-23 DIAGNOSIS — Z789 Other specified health status: Secondary | ICD-10-CM | POA: Insufficient documentation

## 2014-09-23 DIAGNOSIS — R0602 Shortness of breath: Secondary | ICD-10-CM

## 2014-09-23 DIAGNOSIS — Z131 Encounter for screening for diabetes mellitus: Secondary | ICD-10-CM

## 2014-09-23 LAB — GLUCOSE, POCT (MANUAL RESULT ENTRY): POC GLUCOSE: 104 mg/dL — AB (ref 70–99)

## 2014-09-23 NOTE — Progress Notes (Signed)
[facility] [address] [place] [address] [phone]  Date:  09/23/2014   Name:  Angela [name]   DOB:  [birth year]   MRN:  [id number]  PCP:  No PCP Per Patient    Chief Complaint: blood pressure check and sugar checked   History of Present Illness:  Angela [name] is a 49 y.o. very pleasant female patient who presents with the following:  Here today as a new patient. She would like to have her BP and sugar checked today.   She has a history of breast cancer but OW has been healthy.  She does not have a history of DM.   Today she had just "a bite of a vegetables" to eat- OW fasting.   She is here with her son today who helps with interpretation.  He reports that they check her BP at home and is is generally "130" She is a 5 year survivor of breast cancer; she is still on tamoxifen for this.   She also notes that when she walks for exercise she may feel tired or SOB.  She has noted this for about one year. She is able to walk for about 1/2 mild prior to feeling SOB.  She does not have any sx with doing chores at home or other tasks, only with exercise walking   Patient Active Problem List   Diagnosis Date Noted  . Stomach ulcer 07/04/2012  . Breast cancer 07/04/2012  . Malignant neoplasm of breast (female), unspecified site 06/26/2012    Past Medical History  Diagnosis Date  . Breast cancer 06/18/2010    stage II  . Stomach ulcer 07/04/2012    Past Surgical History  Procedure Laterality Date  . Mastectomy with axillary lymph node dissection  06/18/2010    T3N1  . Breast surgery      History  Substance Use Topics  . Smoking status: Never Smoker   . Smokeless tobacco: Never Used  . Alcohol Use: No    Family History  Problem Relation Age of Onset  . Cancer Cousin 30    breast cancer    No Known Allergies  Medication list has been reviewed and updated.  Current Outpatient Prescriptions on File Prior to Visit  Medication Sig Dispense Refill   . fish oil-omega-3 fatty acids 1000 MG capsule Take 2 g by mouth daily.    . hydrocortisone cream 1 % Apply topically 2 (two) times daily.    . hydrocortisone cream 1 % Apply to affected area 3 times daily 30 g 2  . Multiple Vitamins-Minerals (MULTIVITAMIN PO) Take 1 tablet by mouth daily.    Marland Kitchen nystatin cream (MYCOSTATIN) Apply to affected area 3 times daily 30 g 2  . tamoxifen (NOLVADEX) 20 MG tablet Take 1 tablet (20 mg total) by mouth daily. 90 tablet 1  . NON FORMULARY Risek 40mg  as needed for pain     No current facility-administered medications on file prior to visit.    Review of Systems:  As per HPI- otherwise negative.   Physical Examination: Filed Vitals:   09/23/14 1255  BP: 130/82  Pulse: 89  Temp: 97.9 F (36.6 C)  Resp: 16   Filed Vitals:   09/23/14 1255  Height: 5\' 1"  (1.549 m)  Weight: 180 lb (81.647 kg)   Body mass index is 34.03 kg/(m^2). Ideal Body Weight: Weight in (lb) to have BMI = 25: 132  GEN: WDWN, NAD, Non-toxic, A & O x 3, overweight, looks  well HEENT: Atraumatic, Normocephalic. Neck supple. No masses, No LAD. PEERL, oropharynx wnl Ears and Nose: No external deformity. CV: RRR, No M/G/R. No JVD. No thrill. No extra heart sounds. PULM: CTA B, no wheezes, crackles, rhonchi. No retractions. No resp. distress. No accessory muscle use. ABD: S, NT, ND. No rebound. No HSM. EXTR: No c/c/e NEURO Normal gait.  PSYCH: Normally interactive. Conversant. Not depressed or anxious appearing.  Calm demeanor.   Results for orders placed or performed in visit on 09/23/14  POCT glucose (manual entry)  Result Value Ref Range   POC Glucose 104 (A) 70 - 99 mg/dl    Assessment and Plan: Screening for diabetes mellitus - Plan: POCT glucose (manual entry)  Screening for hypertension  Reassured that her BP and glucose look ok. Offered to refer to cardiology regarding SOB with exercise.  There is a language barrier and I can only communicate with her through her  son.  After discussion her son states that she thinks she just has normal fatgiue with exercise and they would like to defer cardiology eval, but they will let me know if any problem persists   Signed Lamar Blinks, MD

## 2014-09-23 NOTE — Patient Instructions (Signed)
Your blood pressure looks good today.  Your blood sugar is in normal range- I do not think that you have diabetes.  We are glad to do a full physical exam and more extensive labs at your convenience.

## 2014-12-06 ENCOUNTER — Other Ambulatory Visit: Payer: Self-pay

## 2014-12-06 DIAGNOSIS — Z9011 Acquired absence of right breast and nipple: Secondary | ICD-10-CM

## 2014-12-06 DIAGNOSIS — Z853 Personal history of malignant neoplasm of breast: Secondary | ICD-10-CM

## 2014-12-06 DIAGNOSIS — Z1231 Encounter for screening mammogram for malignant neoplasm of breast: Secondary | ICD-10-CM

## 2014-12-16 ENCOUNTER — Telehealth: Payer: Self-pay | Admitting: Hematology

## 2014-12-16 ENCOUNTER — Other Ambulatory Visit: Payer: Self-pay | Admitting: Hematology

## 2014-12-16 ENCOUNTER — Ambulatory Visit: Payer: Self-pay

## 2014-12-16 ENCOUNTER — Encounter: Payer: Self-pay | Admitting: Hematology

## 2014-12-16 ENCOUNTER — Ambulatory Visit (HOSPITAL_BASED_OUTPATIENT_CLINIC_OR_DEPARTMENT_OTHER): Payer: Self-pay | Admitting: Hematology

## 2014-12-16 ENCOUNTER — Other Ambulatory Visit (HOSPITAL_BASED_OUTPATIENT_CLINIC_OR_DEPARTMENT_OTHER): Payer: Self-pay

## 2014-12-16 VITALS — BP 152/86 | HR 78 | Temp 98.4°F | Resp 17 | Ht 61.0 in | Wt 171.9 lb

## 2014-12-16 DIAGNOSIS — C50911 Malignant neoplasm of unspecified site of right female breast: Secondary | ICD-10-CM

## 2014-12-16 DIAGNOSIS — Z79811 Long term (current) use of aromatase inhibitors: Secondary | ICD-10-CM

## 2014-12-16 DIAGNOSIS — C773 Secondary and unspecified malignant neoplasm of axilla and upper limb lymph nodes: Secondary | ICD-10-CM

## 2014-12-16 LAB — CBC WITH DIFFERENTIAL/PLATELET
BASO%: 0.7 % (ref 0.0–2.0)
Basophils Absolute: 0 10*3/uL (ref 0.0–0.1)
EOS%: 3.2 % (ref 0.0–7.0)
Eosinophils Absolute: 0.2 10*3/uL (ref 0.0–0.5)
HCT: 36.3 % (ref 34.8–46.6)
HGB: 12.5 g/dL (ref 11.6–15.9)
LYMPH#: 1.8 10*3/uL (ref 0.9–3.3)
LYMPH%: 30.6 % (ref 14.0–49.7)
MCH: 30.2 pg (ref 25.1–34.0)
MCHC: 34.5 g/dL (ref 31.5–36.0)
MCV: 87.7 fL (ref 79.5–101.0)
MONO#: 0.6 10*3/uL (ref 0.1–0.9)
MONO%: 10.2 % (ref 0.0–14.0)
NEUT#: 3.2 10*3/uL (ref 1.5–6.5)
NEUT%: 55.3 % (ref 38.4–76.8)
Platelets: 194 10*3/uL (ref 145–400)
RBC: 4.14 10*6/uL (ref 3.70–5.45)
RDW: 12.7 % (ref 11.2–14.5)
WBC: 5.8 10*3/uL (ref 3.9–10.3)

## 2014-12-16 LAB — COMPREHENSIVE METABOLIC PANEL (CC13)
ALK PHOS: 66 U/L (ref 40–150)
ALT: 27 U/L (ref 0–55)
AST: 28 U/L (ref 5–34)
Albumin: 3.8 g/dL (ref 3.5–5.0)
Anion Gap: 8 mEq/L (ref 3–11)
BUN: 7.7 mg/dL (ref 7.0–26.0)
CALCIUM: 9.4 mg/dL (ref 8.4–10.4)
CHLORIDE: 108 meq/L (ref 98–109)
CO2: 26 meq/L (ref 22–29)
CREATININE: 0.7 mg/dL (ref 0.6–1.1)
Glucose: 104 mg/dl (ref 70–140)
POTASSIUM: 4.2 meq/L (ref 3.5–5.1)
SODIUM: 142 meq/L (ref 136–145)
Total Bilirubin: 0.53 mg/dL (ref 0.20–1.20)
Total Protein: 6.6 g/dL (ref 6.4–8.3)

## 2014-12-16 MED ORDER — LETROZOLE 2.5 MG PO TABS: 2.5000 mg | ORAL_TABLET | Freq: Every day | ORAL | Status: DC

## 2014-12-16 NOTE — Progress Notes (Signed)
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    STAGE:  Stage III ( Invasive Ductal Carcinoma, ER+ Her2Neu+)  Right breast s/p mastectomy with ALND, 5.3 cm    PRIOR THERAPY: #1originally from [place]. Patient had her annual mammogram performed in November 2012 and she felt a mass. In 05/08/2011 patient underwent a right modified radical mastectomy with level II axillary clearance. Her final pathology revealed a invasive ductal carcinoma that was grade 3 measuring 5.3 cm in greatest dimension. All margins were negative. She was noted to have level lymphovascular invasion. Patient had 22 lymph nodes removed one was positive for metastatic disease without extracapsular extension.. Patient went on to receive adjuvant chemotherapy consisting of 6 cycles of Taxotere Adriamycin and Cytoxan. The tumor was ER positive PR positive HER-2/neu positive.She was then placed on tamoxifen 20 mg on a daily basis. She tolerated this quite wel   CURRENT THERAPY: tamoxifen 20 mg daily since 2013  INTERVAL HISTORY: Angela [name] 49 y.o. female returns for followup visit today. She is accompanied today by her son F'nu and his [place] who both act as interpreters for Angela [name].  She is tolerating tamoxifen without any significant problems. She is doing well overall. She has hemorroids which causes some rectal discomfort, no bleeding. She eats healthy, is physically active at home, and trying to loose some weight, and she lost about 5kg in the past 2 months. She states her last menstrual period was in 2011, approximately 1 year before she was diagnosed with breast cancer. She denies any significant hot flash, mood swing, pain or other symptoms. She has good appetite and energy level.   MEDICAL HISTORY: Past Medical History   Diagnosis Date  . Breast cancer 06/18/2010    stage II  . Stomach ulcer 07/04/2012    ALLERGIES:  has No Known Allergies.  MEDICATIONS:  Current Outpatient Prescriptions  Medication Sig Dispense Refill  . fish oil-omega-3 fatty acids 1000 MG capsule Take 2 g by mouth daily.    . hydrocortisone cream 1 % Apply topically 2 (two) times daily.    . hydrocortisone cream 1 % Apply to affected area 3 times daily 30 g 2  . Multiple Vitamins-Minerals (MULTIVITAMIN PO) Take 1 tablet by mouth daily.    . NON FORMULARY Risek 55m as needed for pain    . nystatin cream (MYCOSTATIN) Apply to affected area 3 times daily 30 g 2  . tamoxifen (NOLVADEX) 20 MG tablet Take 1 tablet (20 mg total) by mouth daily. 90 tablet 1   No current facility-administered medications for this visit.    SURGICAL HISTORY:  Past Surgical History  Procedure Laterality Date  . Mastectomy with axillary lymph node dissection  06/18/2010    T3N1  . Breast surgery      REVIEW OF SYSTEMS:  Pertinent items are noted in HPI.   HEALTH MAINTENANCE: Last mammogram 01/08/2014. There was no mammographic evidence of malignancy. Recommendation for screening mammogram in 1 year  PHYSICAL EXAMINATION: Blood pressure 152/86, pulse 78, temperature 98.4 F (36.9 C), temperature source Oral, resp. rate 17, height '5\' 1"'  (1.549 m), weight 171 lb 14.4 oz (77.973 kg), SpO2 98 %. Body mass index is 32.5 kg/(m^2).  ECOG PERFORMANCE STATUS: 0 - Asymptomatic  PHYSICAL EXAMINATION: General appearance: alert, cooperative, appears stated age and no distress  Head: Normocephalic, without obvious abnormality, atraumatic Neck: no adenopathy, no carotid bruit, no JVD, supple, symmetrical, trachea midline and thyroid not enlarged, symmetric, no tenderness/mass/nodules Lymph nodes: Cervical, supraclavicular, and axillary nodes normal. Resp: clear to auscultation bilaterally Back: symmetric, no curvature. ROM normal. No CVA tenderness. Cardio:  regular rate and rhythm, S1, S2 normal, no murmur, click, rub or gallop GI: soft, non-tender; bowel sounds normal; no masses,  no organomegaly Extremities: extremities normal, atraumatic, no cyanosis or edema Neurologic: Alert and oriented X 3, normal strength and tone. Normal symmetric reflexes. Normal coordination and gait Breasts: Right mastectomy, incision is well-healed without evidence of skin changes or local recurrence. Left breast without any palpable masses, skin changes of concern nipple changes or nipple discharge. Nothing palpable in either axilla.   LABORATORY DATA: CBC Latest Ref Rng 12/16/2014 06/17/2014 12/14/2013  WBC 3.9 - 10.3 10e3/uL 5.8 6.0 5.2  Hemoglobin 11.6 - 15.9 g/dL 12.5 12.6 12.7  Hematocrit 34.8 - 46.6 % 36.3 37.4 37.2  Platelets 145 - 400 10e3/uL 194 177 193    CMP Latest Ref Rng 12/16/2014 06/17/2014 12/14/2013  Glucose 70 - 140 mg/dl 104 107 114  BUN 7.0 - 26.0 mg/dL 7.7 14.4 10.2  Creatinine 0.6 - 1.1 mg/dL 0.7 0.7 0.7  Sodium 136 - 145 mEq/L 142 142 142  Potassium 3.5 - 5.1 mEq/L 4.2 4.0 4.3  Chloride 98 - 107 mEq/L - - -  CO2 22 - 29 mEq/L '26 25 25  ' Calcium 8.4 - 10.4 mg/dL 9.4 9.1 9.5  Total Protein 6.4 - 8.3 g/dL 6.6 6.7 6.9  Total Bilirubin 0.20 - 1.20 mg/dL 0.53 0.48 0.49  Alkaline Phos 40 - 150 U/L 66 79 78  AST 5 - 34 U/L '28 21 20  ' ALT 0 - 55 U/L '27 16 19        ' RADIOGRAPHIC STUDIES: Digital screening mammogram, left 01/08/2014 FINDINGS: There are no findings suspicious for malignancy. Images were processed with CAD.   ASSESSMENT: 49 year old female with  1. stage III invasive ductal carcinoma ER positive HER-2/neu positive, diagnosed in 05/2011 - status post right mastectomy with axillary lymph node dissection for a 5.3 cm node-positive disease. Patient subsequently underwent adjuvant chemotherapy consisting of 6 cycles of Taxotere Adriamycin and Cytoxan. She did not receive anti-HER-2 therapy due to availability and financial  issue. -She has been on adjuvant Tamoxifen 20 mg daily since mid year 2013. She has been tolerating it very well  without noticeable side effects. I discussed the option of continue tamoxifen for total of 10 years, versus change to aromatase inhibitor for additional 5 years if she is truly postmenopausal. Potential side effects of AI were explained to patient and her family members, which includes but not limited to, hot flash, skin no dryness, cardiovascular risks, joint and muscular discomfort, osteoporosis, etc. She agrees to try.  -Her last menstrual period was one year before her cancer diagnosis, she is likely postmenopausal. I will check her hormone level, to see if she is post-menopause. -Patient  is clinically doing well, last mammogram and current physical exam revealed no evidence of recurrent disease -I'll repeat her tumor marker CA 27.29 -We'll continue annual screening mammogram. She is due next months. I'll schedule for her  -I encouraged her to continue healthy diet and regular exercise   2. Bone health -She has not had a bone density scan. I'll schedule for her -I encouraged her to take calcium and vitamin D for bone health   3. Hemorroids -She is symptomatic,  tried over-the-counter steroids, did not help  -She has never had a screening colonoscopy, I recommend her to have colonoscopy for colon cancer screening  -We'll refer her to GI   Follow up -If her FSH/LH tests indicates she is post-menopause, I'll switch her tamoxifen to letrozole. I'll call her with the test results. -I'll see her back in 6 months with lab -Mammogram and bone density scan next months -GI referral   All questions were answered. The patient knows to call the clinic with any problems, questions or concerns. We can certainly see the patient much sooner if necessary.  I spent 30 minutes counseling the patient face to face. The total time spent in the appointment was 40 minutes.   Burr Medico,  Teagen Bucio,MD 12/16/2014, 10:44 AM       Addendum -Her FSH is 28.4, LH 10.8. She is probably still in perimenopause -CA 27.29 is normal. -We'll continue her tamoxifen. I informed patient's son about the test results.  [name]  12/18/2014

## 2014-12-16 NOTE — Telephone Encounter (Signed)
Pt confirmed labs/ov per 07/11 POF, gave pt AVS and Calendar.... KJ, SCHEDULED PT FOR BONE DENSITY SAME DAY AS MM PT AWARE

## 2014-12-17 LAB — LUTEINIZING HORMONE: LH: 10.8 m[IU]/mL

## 2014-12-17 LAB — CANCER ANTIGEN 27.29: CA 27.29: 29 U/mL (ref 0–39)

## 2014-12-17 LAB — FOLLICLE STIMULATING HORMONE: FSH: 28.4 m[IU]/mL

## 2014-12-20 LAB — ESTRADIOL, ULTRA SENS: ESTRADIOL, ULTRA SENSITIVE: 5 pg/mL

## 2015-01-10 ENCOUNTER — Ambulatory Visit
Admission: RE | Admit: 2015-01-10 | Discharge: 2015-01-10 | Disposition: A | Discharge status: Home / Self Care | Payer: No Typology Code available for payment source | Source: Ambulatory Visit

## 2015-01-10 ENCOUNTER — Ambulatory Visit
Admission: RE | Admit: 2015-01-10 | Discharge: 2015-01-10 | Disposition: A | Discharge status: Home / Self Care | Payer: No Typology Code available for payment source | Source: Ambulatory Visit | Attending: Hematology | Admitting: Hematology

## 2015-01-10 DIAGNOSIS — Z79811 Long term (current) use of aromatase inhibitors: Secondary | ICD-10-CM

## 2015-01-10 DIAGNOSIS — Z853 Personal history of malignant neoplasm of breast: Secondary | ICD-10-CM

## 2015-01-10 DIAGNOSIS — C50911 Malignant neoplasm of unspecified site of right female breast: Secondary | ICD-10-CM

## 2015-01-10 DIAGNOSIS — Z1231 Encounter for screening mammogram for malignant neoplasm of breast: Secondary | ICD-10-CM

## 2015-01-10 DIAGNOSIS — Z9011 Acquired absence of right breast and nipple: Secondary | ICD-10-CM

## 2015-02-11 ENCOUNTER — Other Ambulatory Visit: Payer: Self-pay | Admitting: Physician Assistant

## 2015-02-11 ENCOUNTER — Encounter: Payer: Self-pay | Admitting: Gastroenterology

## 2015-02-14 ENCOUNTER — Other Ambulatory Visit: Payer: Self-pay | Admitting: *Deleted

## 2015-02-14 DIAGNOSIS — C50919 Malignant neoplasm of unspecified site of unspecified female breast: Secondary | ICD-10-CM

## 2015-02-17 ENCOUNTER — Encounter: Payer: Self-pay | Admitting: Hematology

## 2015-02-17 ENCOUNTER — Other Ambulatory Visit: Payer: Self-pay | Admitting: Hematology and Oncology

## 2015-02-17 ENCOUNTER — Other Ambulatory Visit (HOSPITAL_BASED_OUTPATIENT_CLINIC_OR_DEPARTMENT_OTHER): Payer: Self-pay

## 2015-02-17 ENCOUNTER — Telehealth: Payer: Self-pay | Admitting: Hematology

## 2015-02-17 ENCOUNTER — Ambulatory Visit (HOSPITAL_BASED_OUTPATIENT_CLINIC_OR_DEPARTMENT_OTHER): Payer: Self-pay | Admitting: Hematology

## 2015-02-17 VITALS — BP 146/78 | HR 79 | Temp 98.1°F | Resp 17 | Ht 61.0 in | Wt 170.3 lb

## 2015-02-17 DIAGNOSIS — Z853 Personal history of malignant neoplasm of breast: Secondary | ICD-10-CM

## 2015-02-17 DIAGNOSIS — C50911 Malignant neoplasm of unspecified site of right female breast: Secondary | ICD-10-CM

## 2015-02-17 DIAGNOSIS — C50919 Malignant neoplasm of unspecified site of unspecified female breast: Secondary | ICD-10-CM

## 2015-02-17 LAB — COMPREHENSIVE METABOLIC PANEL (CC13)
ALBUMIN: 3.8 g/dL (ref 3.5–5.0)
ALK PHOS: 74 U/L (ref 40–150)
ALT: 26 U/L (ref 0–55)
AST: 27 U/L (ref 5–34)
Anion Gap: 8 mEq/L (ref 3–11)
BILIRUBIN TOTAL: 0.45 mg/dL (ref 0.20–1.20)
BUN: 11.7 mg/dL (ref 7.0–26.0)
CO2: 26 mEq/L (ref 22–29)
Calcium: 9.4 mg/dL (ref 8.4–10.4)
Chloride: 106 mEq/L (ref 98–109)
Creatinine: 0.7 mg/dL (ref 0.6–1.1)
GLUCOSE: 112 mg/dL (ref 70–140)
Potassium: 4.1 mEq/L (ref 3.5–5.1)
SODIUM: 140 meq/L (ref 136–145)
TOTAL PROTEIN: 6.7 g/dL (ref 6.4–8.3)

## 2015-02-17 LAB — CBC WITH DIFFERENTIAL/PLATELET
BASO%: 0.8 % (ref 0.0–2.0)
Basophils Absolute: 0 10*3/uL (ref 0.0–0.1)
EOS ABS: 0.1 10*3/uL (ref 0.0–0.5)
EOS%: 2.6 % (ref 0.0–7.0)
HCT: 37 % (ref 34.8–46.6)
HEMOGLOBIN: 12.5 g/dL (ref 11.6–15.9)
LYMPH#: 1.7 10*3/uL (ref 0.9–3.3)
LYMPH%: 29.1 % (ref 14.0–49.7)
MCH: 30 pg (ref 25.1–34.0)
MCHC: 33.9 g/dL (ref 31.5–36.0)
MCV: 88.5 fL (ref 79.5–101.0)
MONO#: 0.6 10*3/uL (ref 0.1–0.9)
MONO%: 10 % (ref 0.0–14.0)
NEUT%: 57.5 % (ref 38.4–76.8)
NEUTROS ABS: 3.3 10*3/uL (ref 1.5–6.5)
Platelets: 194 10*3/uL (ref 145–400)
RBC: 4.18 10*6/uL (ref 3.70–5.45)
RDW: 12.6 % (ref 11.2–14.5)
WBC: 5.7 10*3/uL (ref 3.9–10.3)

## 2015-02-17 MED ORDER — TAMOXIFEN CITRATE 20 MG PO TABS: 20.0000 mg | ORAL_TABLET | Freq: Every day | ORAL | Status: DC

## 2015-02-17 NOTE — Progress Notes (Addendum)
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    STAGE:  Stage III ( Invasive Ductal Carcinoma, ER+ Her2Neu+)  Right breast s/p mastectomy with ALND, 5.3 cm    PRIOR THERAPY: #1originally from [place]. Patient had her annual mammogram performed in November 2012 and she felt a mass. In 05/08/2011 patient underwent a right modified radical mastectomy with level II axillary clearance. Her final pathology revealed a invasive ductal carcinoma that was grade 3 measuring 5.3 cm in greatest dimension. All margins were negative. She was noted to have level lymphovascular invasion. Patient had 22 lymph nodes removed one was positive for metastatic disease without extracapsular extension.. Patient went on to receive adjuvant chemotherapy consisting of 6 cycles of Taxotere Adriamycin and Cytoxan. The tumor was ER positive PR positive HER-2/neu positive.She was then placed on tamoxifen 20 mg on a daily basis. She tolerated this quite wel   CURRENT THERAPY: tamoxifen 20 mg daily since 2013  INTERVAL HISTORY: Angela [name] 49 y.o. female returns for followup visit today. She is accompanied today by her son Angela [name] and his Angela [name] who both act as interpreters for Angela [name].  She is Doing well overall. Her only complaint is her hemorrhoids.   MEDICAL HISTORY: Past Medical History  Diagnosis Date  . Breast cancer 06/18/2010    stage II  . Stomach ulcer 07/04/2012    ALLERGIES:  has No Known Allergies.  MEDICATIONS:  Current Outpatient Prescriptions  Medication Sig Dispense Refill  . fish oil-omega-3 fatty acids 1000 MG capsule Take 2 g by mouth daily.    . hydrocortisone cream 1 % Apply topically 2 (two) times daily.    . hydrocortisone cream 1 % Apply to affected area 3 times daily 30 g 2  . Multiple  Vitamins-Minerals (MULTIVITAMIN PO) Take 1 tablet by mouth daily.    . NON FORMULARY Risek 41m as needed for pain    . nystatin cream (MYCOSTATIN) Apply to affected area 3 times daily 30 g 2  . tamoxifen (NOLVADEX) 20 MG tablet Take 1 tablet (20 mg total) by mouth daily. 90 tablet 1  . letrozole (FEMARA) 2.5 MG tablet Take 1 tablet (2.5 mg total) by mouth daily. (Patient not taking: Reported on 02/17/2015) 30 tablet 2   No current facility-administered medications for this visit.    SURGICAL HISTORY:  Past Surgical History  Procedure Laterality Date  . Mastectomy with axillary lymph node dissection  06/18/2010    T3N1  . Breast surgery      REVIEW OF SYSTEMS:  Pertinent items are noted in HPI.   HEALTH MAINTENANCE: Last mammogram 01/08/2014. There was no mammographic evidence of malignancy. Recommendation for screening mammogram in 1 year  PHYSICAL EXAMINATION: Blood pressure 146/78, pulse 79, temperature 98.1 F (36.7 C), temperature source Oral, resp. rate 17, height _0  (1.549 m), weight 170 lb 4.8 oz (77.248 kg), SpO2 100 %. Body mass index is 32.19 kg/(m^2).  ECOG PERFORMANCE STATUS: 0 - Asymptomatic  PHYSICAL EXAMINATION: General appearance: alert, cooperative, appears stated age and no distress Head: Normocephalic, without obvious abnormality, atraumatic Neck: no adenopathy, no carotid bruit, no JVD, supple, symmetrical, trachea midline and thyroid not enlarged, symmetric, no tenderness/mass/nodules Lymph nodes: Cervical, supraclavicular, and axillary nodes normal. Resp: clear to auscultation bilaterally Back: symmetric, no curvature. ROM normal. No CVA tenderness. Cardio: regular  rate and rhythm, S1, S2 normal, no murmur, click, rub or gallop GI: soft, non-tender; bowel sounds normal; no masses,  no organomegaly Extremities: extremities normal, atraumatic, no cyanosis or edema Neurologic: Alert and oriented X 3, normal strength and tone. Normal symmetric reflexes. Normal  coordination and gait Breasts: Right mastectomy, incision is well-healed without evidence of skin changes or local recurrence. Left breast without any palpable masses, skin changes of concern nipple changes or nipple discharge. Nothing palpable in either axilla.   LABORATORY DATA: CBC Latest Ref Rng 02/17/2015 12/16/2014 06/17/2014  WBC 3.9 - 10.3 10e3/uL 5.7 5.8 6.0  Hemoglobin 11.6 - 15.9 g/dL 12.5 12.5 12.6  Hematocrit 34.8 - 46.6 % 37.0 36.3 37.4  Platelets 145 - 400 10e3/uL 194 194 177    CMP Latest Ref Rng 02/17/2015 12/16/2014 06/17/2014  Glucose 70 - 140 mg/dl 112 104 107  BUN 7.0 - 26.0 mg/dL 11.7 7.7 14.4  Creatinine 0.6 - 1.1 mg/dL 0.7 0.7 0.7  Sodium 136 - 145 mEq/L 140 142 142  Potassium 3.5 - 5.1 mEq/L 4.1 4.2 4.0  Chloride 98 - 107 mEq/L - - -  CO2 22 - 29 mEq/L _0 Calcium 8.4 - 10.4 mg/dL 9.4 9.4 9.1  Total Protein 6.4 - 8.3 g/dL 6.7 6.6 6.7  Total Bilirubin 0.20 - 1.20 mg/dL 0.45 0.53 0.48  Alkaline Phos 40 - 150 U/L 74 66 79  AST 5 - 34 U/L _1 ALT 0 - 55 U/L _2 CA 27.29  Status: Finalresult Visible to patient:  Not Released Nextappt: 08/25/2015 at 09:30 AM in Oncology ([facility]) Dx:  Breast cancer, right           Ref Range 6moago  250yrgo     CA 27.29 0 - 39 U/mL 29 29           RADIOGRAPHIC STUDIES: Mammogram 01/10/2015  FINDINGS: The patient has had a right mastectomy. There are no findings suspicious for malignancy.  Images were processed with CAD.  IMPRESSION: No mammographic evidence of malignancy. A result letter of this screening mammogram will be mailed directly to the patient.  Bone density scan 01/10/2015 ASSESSMENT: Patient's diagnostic category is NORMAL by WHO Criteria.  ASSESSMENT: [age] old perimenopause female with  1. stage III invasive ductal carcinoma ER positive HER-2/neu positive, diagnosed in 05/2011 - status post right mastectomy with axillary lymph node dissection  for a 5.3 cm node-positive disease. Patient subsequently underwent adjuvant chemotherapy consisting of 6 cycles of Taxotere Adriamycin and Cytoxan. She did not receive anti-HER-2 therapy due to availability and financial issue. -She has been on adjuvant Tamoxifen 20 mg daily since mid year 2013. She has been tolerating it very well  without noticeable side effects. I discussed the option of continue tamoxifen for total of 10 years, versus change to aromatase inhibitor for additional 5 years if she is truly postmenopausal. [facility], she is still not in post menopause yet, we'll continue tamoxifen -Patient  is clinically doing well, last mammogram and current physical exam revealed no evidence of recurrent disease -repeated CA 27.29 in July 2016 was normal -present repeated screening mammogram was negative -I encouraged her to continue healthy diet and regular exercise   2. Bone health -recent bone density scan was normal -I encouraged her to take calcium and vitamin D for bone health   3. Hemorroids -She is symptomatic, tried over-the-counter steroids, did not help  -  She has never had a screening colonoscopy, I recommend her to have colonoscopy for colon cancer screening  -We'll refer her to GI (was not done last visit)  Follow up -continue tamoxifen -I'll see her back in 6 months, and likely lab every 6 months, and see me every 4 months afterwards. -she does not have a primary care physician, I strongly encouraged her to have one. Her son will call for her.   All questions were answered. The patient knows to call the clinic with any problems, questions or concerns. We can certainly see the patient much sooner if necessary.  I spent 20 minutes counseling the patient face to face. The total time spent in the appointment was 30 minutes.   Burr Medico, Sharonica Kraszewski,MD 02/17/2015, 11:11 AM

## 2015-02-17 NOTE — Telephone Encounter (Signed)
gave adn printed appt sched and avs for pt for march 2016....gv pt Angela [name] info to call and sched primary MD..Marland Kitchen

## 2015-07-03 MED FILL — TAMOXIFEN 20 MG TABLET: 20 | 90 days supply | Qty: 90 | Fill #2

## 2015-08-25 ENCOUNTER — Telehealth: Payer: Self-pay | Admitting: Hematology

## 2015-08-25 ENCOUNTER — Telehealth: Payer: Self-pay | Admitting: Gastroenterology

## 2015-08-25 ENCOUNTER — Encounter: Payer: Self-pay | Admitting: Hematology

## 2015-08-25 ENCOUNTER — Other Ambulatory Visit: Payer: Self-pay | Admitting: Hematology

## 2015-08-25 ENCOUNTER — Other Ambulatory Visit (HOSPITAL_BASED_OUTPATIENT_CLINIC_OR_DEPARTMENT_OTHER): Payer: Self-pay

## 2015-08-25 ENCOUNTER — Ambulatory Visit (HOSPITAL_BASED_OUTPATIENT_CLINIC_OR_DEPARTMENT_OTHER): Payer: Self-pay | Admitting: Hematology

## 2015-08-25 VITALS — BP 132/67 | HR 80 | Temp 97.8°F | Resp 18 | Ht 61.0 in | Wt 177.1 lb

## 2015-08-25 DIAGNOSIS — C779 Secondary and unspecified malignant neoplasm of lymph node, unspecified: Secondary | ICD-10-CM

## 2015-08-25 DIAGNOSIS — K649 Unspecified hemorrhoids: Secondary | ICD-10-CM

## 2015-08-25 DIAGNOSIS — Z1231 Encounter for screening mammogram for malignant neoplasm of breast: Secondary | ICD-10-CM

## 2015-08-25 DIAGNOSIS — C50919 Malignant neoplasm of unspecified site of unspecified female breast: Secondary | ICD-10-CM

## 2015-08-25 DIAGNOSIS — C50911 Malignant neoplasm of unspecified site of right female breast: Secondary | ICD-10-CM

## 2015-08-25 LAB — COMPREHENSIVE METABOLIC PANEL
ALBUMIN: 3.8 g/dL (ref 3.5–5.0)
ALK PHOS: 74 U/L (ref 40–150)
ALT: 22 U/L (ref 0–55)
AST: 24 U/L (ref 5–34)
Anion Gap: 8 mEq/L (ref 3–11)
BILIRUBIN TOTAL: 0.43 mg/dL (ref 0.20–1.20)
BUN: 11.4 mg/dL (ref 7.0–26.0)
CALCIUM: 9.3 mg/dL (ref 8.4–10.4)
CO2: 29 mEq/L (ref 22–29)
Chloride: 105 mEq/L (ref 98–109)
Creatinine: 0.8 mg/dL (ref 0.6–1.1)
EGFR: 89 mL/min/{1.73_m2} — AB (ref >90)
GLUCOSE: 139 mg/dL (ref 70–140)
Potassium: 4.2 mEq/L (ref 3.5–5.1)
SODIUM: 142 meq/L (ref 136–145)
TOTAL PROTEIN: 7.1 g/dL (ref 6.4–8.3)

## 2015-08-25 LAB — CBC WITH DIFFERENTIAL/PLATELET
BASO%: 0.5 % (ref 0.0–2.0)
BASOS ABS: 0 10*3/uL (ref 0.0–0.1)
EOS ABS: 0.2 10*3/uL (ref 0.0–0.5)
EOS%: 3.3 % (ref 0.0–7.0)
HEMATOCRIT: 37.2 % (ref 34.8–46.6)
HEMOGLOBIN: 12.9 g/dL (ref 11.6–15.9)
LYMPH#: 1.9 10*3/uL (ref 0.9–3.3)
LYMPH%: 33.8 % (ref 14.0–49.7)
MCH: 30.5 pg (ref 25.1–34.0)
MCHC: 34.7 g/dL (ref 31.5–36.0)
MCV: 87.9 fL (ref 79.5–101.0)
MONO#: 0.6 10*3/uL (ref 0.1–0.9)
MONO%: 10.8 % (ref 0.0–14.0)
NEUT%: 51.6 % (ref 38.4–76.8)
NEUTROS ABS: 2.8 10*3/uL (ref 1.5–6.5)
Platelets: 193 10*3/uL (ref 145–400)
RBC: 4.23 10*6/uL (ref 3.70–5.45)
RDW: 12.5 % (ref 11.2–14.5)
WBC: 5.5 10*3/uL (ref 3.9–10.3)

## 2015-08-25 NOTE — Telephone Encounter (Signed)
Krista Blue,  Not sure what happened with the original referral but I'll make sure it's taken care of.  Patty, This woman needs appt with first available MD or extender, would be best if [facility] since they would be able to help with hemorrhoids if she needs banding.  Thanks.  If it's gonna be more that 2-3 weeks for a visit please double book with me sometime to just get her in.    Radonna Ricker

## 2015-08-25 NOTE — Telephone Encounter (Signed)
Gave and printed appt sched and avs for pt for Sept °

## 2015-08-25 NOTE — Addendum Note (Signed)
Addended by: Elray Buba LE on: 08/25/2015 03:39 PM   Modules accepted: Orders

## 2015-08-25 NOTE — Addendum Note (Signed)
Addended by: [name] on: 08/25/2015 01:13 PM   Modules accepted: Orders

## 2015-08-25 NOTE — Progress Notes (Signed)
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    STAGE:  Stage III ( Invasive Ductal Carcinoma, ER+ Her2Neu+)  Right breast s/p mastectomy with ALND, 5.3 cm    PRIOR THERAPY: #1originally from [place]. Patient had her annual mammogram performed in November 2012 and she felt a mass. In 05/08/2011 patient underwent a right modified radical mastectomy with level II axillary clearance. Her final pathology revealed a invasive ductal carcinoma that was grade 3 measuring 5.3 cm in greatest dimension. All margins were negative. She was noted to have level lymphovascular invasion. Patient had 22 lymph nodes removed one was positive for metastatic disease without extracapsular extension.. Patient went on to receive adjuvant chemotherapy consisting of 6 cycles of Taxotere Adriamycin and Cytoxan. The tumor was ER positive PR positive HER-2/neu positive.She was then placed on tamoxifen 20 mg on a daily basis. She tolerated this quite well   CURRENT THERAPY: tamoxifen 20 mg daily since 2013  INTERVAL HISTORY: Angela Angela [name] 50 y.o. Angela [name] returns for followup visit today. She is accompanied today by her son Angela [name] and his [place] who both act as interpreters for Angela Angela [name].  She is Doing well overall.  She is compliant with tamoxifen, tolerates very well, without significant side effects. She denies hot flash, pain, abdominal discomfort or other symptoms. Her son has been watching her diet, try to reduce her sugar and fat intake, and encouraged her eat healthier, and exercise more.  She has good appetite and energy level,  Her weight has been stable.   MEDICAL HISTORY: Past Medical History  Diagnosis Date  . Breast cancer ([place]) 06/18/2010    stage II  . Stomach ulcer 07/04/2012    ALLERGIES:  has No Known  Allergies.  MEDICATIONS:  Current Outpatient Prescriptions  Medication Sig Dispense Refill  . fish oil-omega-3 fatty acids 1000 MG capsule Take 2 g by mouth daily.    . hydrocortisone cream 1 % Apply topically 2 (two) times daily.    . hydrocortisone cream 1 % Apply to affected area 3 times daily 30 g 2  . Multiple Vitamins-Minerals (MULTIVITAMIN PO) Take 1 tablet by mouth daily.    . NON FORMULARY Risek 72m as needed for pain    . nystatin cream (MYCOSTATIN) Apply to affected area 3 times daily 30 g 2  . tamoxifen (NOLVADEX) 20 MG tablet TAKE 1 TABLET BY MOUTH ONCE DAILY 30 tablet 1   No current facility-administered medications for this visit.    SURGICAL HISTORY:  Past Surgical History  Procedure Laterality Date  . Mastectomy with axillary lymph node dissection  06/18/2010    T3N1  . Breast surgery      REVIEW OF SYSTEMS:  Pertinent items are noted in HPI.   HEALTH MAINTENANCE: Last mammogram 01/08/2014. There was no mammographic evidence of malignancy. Recommendation for screening mammogram in 1 year  PHYSICAL EXAMINATION: Blood pressure 132/67, pulse 80, temperature 97.8 F (36.6 C), temperature source Oral, resp. rate 18, height _0  (1.549 m), weight 177 lb 1.6 oz (80.332 kg), SpO2 100 %. Body mass index is 33.48 kg/(m^2).  ECOG PERFORMANCE STATUS: 0 - Asymptomatic  PHYSICAL EXAMINATION: General appearance: alert, cooperative, appears stated age and no distress Head: Normocephalic, without obvious abnormality, atraumatic Neck: no adenopathy, no carotid bruit, no JVD, supple, symmetrical, trachea midline and thyroid not  enlarged, symmetric, no tenderness/mass/nodules Lymph nodes: Cervical, supraclavicular, and axillary nodes normal. Resp: clear to auscultation bilaterally Back: symmetric, no curvature. ROM normal. No CVA tenderness. Cardio: regular rate and rhythm, S1, S2 normal, no murmur, click, rub or gallop GI: soft, non-tender; bowel sounds normal; no masses,  no  organomegaly Extremities: extremities normal, atraumatic, no cyanosis or edema Neurologic: Alert and oriented X 3, normal strength and tone. Normal symmetric reflexes. Normal coordination and gait Breasts: Right mastectomy, incision is well-healed without evidence of skin changes or local recurrence. Left breast without any palpable masses, skin changes of concern nipple changes or nipple discharge. Nothing palpable in either axilla.   LABORATORY DATA: CBC Latest Ref Rng 08/25/2015 02/17/2015 12/16/2014  WBC 3.9 - 10.3 10e3/uL 5.5 5.7 5.8  Hemoglobin 11.6 - 15.9 g/dL 12.9 12.5 12.5  Hematocrit 34.8 - 46.6 % 37.2 37.0 36.3  Platelets 145 - 400 10e3/uL 193 194 194    CMP Latest Ref Rng 08/25/2015 02/17/2015 12/16/2014  Glucose 70 - 140 mg/dl 139 112 104  BUN 7.0 - 26.0 mg/dL 11.4 11.7 7.7  Creatinine 0.6 - 1.1 mg/dL 0.8 0.7 0.7  Sodium 136 - 145 mEq/L 142 140 142  Potassium 3.5 - 5.1 mEq/L 4.2 4.1 4.2  CO2 22 - 29 mEq/L _0 Calcium 8.4 - 10.4 mg/dL 9.3 9.4 9.4  Total Protein 6.4 - 8.3 g/dL 7.1 6.7 6.6  Total Bilirubin 0.20 - 1.20 mg/dL 0.43 0.45 0.53  Alkaline Phos 40 - 150 U/L 74 74 66  AST 5 - 34 U/L _1 ALT 0 - 55 U/L _2 CA 27.29  Status: Finalresult Visible to patient:  Not Released Nextappt: 08/25/2015 at 09:30 AM in Oncology ([facility]) Dx:  Breast cancer, right           Ref Range 53moago  2105yrgo     CA 27.29 0 - 39 U/mL 29 29           RADIOGRAPHIC STUDIES: Mammogram 01/10/2015  FINDINGS: The patient has had a right mastectomy. There are no findings suspicious for malignancy.  Images were processed with CAD.  IMPRESSION: No mammographic evidence of malignancy. A result letter of this screening mammogram will be mailed directly to the patient.  Bone density scan 01/10/2015 ASSESSMENT: Patient's diagnostic category is NORMAL by WHO Criteria.  ASSESSMENT: [age]ear old perimenopause Angela [name] with  1. stage III  invasive ductal carcinoma ER positive HER-2/neu positive, diagnosed in 05/2011 - status post right mastectomy with axillary lymph node dissection for a 5.3 cm node-positive disease. Patient subsequently underwent adjuvant chemotherapy consisting of 6 cycles of Taxotere Adriamycin and Cytoxan. She did not receive anti-HER-2 therapy due to availability and financial issue. -She has been on adjuvant Tamoxifen 20 mg daily since mid year 2013. She has been tolerating it very well  without noticeable side effects. I discussed the option of continue tamoxifen for total of 10 years, versus change to aromatase inhibitor for additional 5 years if she is truly postmenopausal. [facility], she is still not in post menopause yet, we'll continue tamoxifen -Patient  is clinically doing well, lab, last mammogram and current physical exam revealed no evidence of recurrent disease -repeated CA 27.29 in July 2016 was normal, today's  Result is still pending. -I encouraged her to continue healthy diet and regular exercise   2. Bone health -recent bone density scan was normal -I encouraged her to take  calcium and vitamin D for bone health   3. Hemorroids -She is symptomatic, tried over-the-counter steroids, did not help  -She has never had a screening colonoscopy, I recommend her to have colonoscopy for colon cancer screening  -We'll refer her to [facility]   Follow up -continue tamoxifen - Mammogram in August -I'll see her back in 6 months with lab  -GI referral    All questions were answered. The patient knows to call the clinic with any problems, questions or concerns. We can certainly see the patient much sooner if necessary.  I spent 20 minutes counseling the patient face to face. The total time spent in the appointment was 30 minutes.   Burr Medico, Mayukha Symmonds,MD 08/25/2015

## 2015-08-25 NOTE — Telephone Encounter (Signed)
-----   Message from [name], MD sent at 08/25/2015  1:13 PM EDT ----- Linna Hoff,  She is a pt with history of breast cancer.  She Has Symptomatic hemorrhoid and wants to see GI. She also needs a screening colonoscopy. I referred her to your group before but appointment was never scheduled. Could you please let your scheduler to give her an appointment?  Thanks much,  Krista Blue

## 2015-08-25 NOTE — Progress Notes (Signed)
Called son and left message on voice mail re:  Reminder for pt not to eat or drink on the morning of 02/23/16 for labs to be drawn - lipid panel to be drawn.   Left message that pt can eat or drink after lab appt.

## 2015-08-26 LAB — CANCER ANTIGEN 27.29: CAN 27.29: 38.3 U/mL (ref 0.0–38.6)

## 2015-08-26 LAB — CANCER ANTIGEN 27-29 (PARALLEL TESTING): CA 27.29: 31 U/mL (ref <38)

## 2015-08-26 NOTE — Progress Notes (Signed)
Left message on son's vm to call back for good results.

## 2015-08-26 NOTE — Telephone Encounter (Signed)
The pt has been scheduled to see Dr Havery Moros on 08/28/15.   Left message on machine to call back

## 2015-08-26 NOTE — Telephone Encounter (Signed)
Great, thanks much!  Angela [name]

## 2015-08-26 NOTE — Progress Notes (Signed)
Left message for son to call back for results.

## 2015-08-28 ENCOUNTER — Ambulatory Visit: Payer: Self-pay | Admitting: Gastroenterology

## 2015-08-28 ENCOUNTER — Telehealth: Payer: Self-pay | Admitting: *Deleted

## 2015-08-28 NOTE — Telephone Encounter (Signed)
Left message on machine to call back Pt has an appt today with Dr Havery Moros.

## 2015-08-28 NOTE — Telephone Encounter (Signed)
Spoke with son and informed him re:  Pt's tumor marker  CA 27.29  Result is normal as per Dr. Burr Medico.  Son voiced understanding and stated he would relay message to pt.

## 2015-09-03 ENCOUNTER — Telehealth: Payer: Self-pay

## 2015-09-03 ENCOUNTER — Telehealth: Payer: Self-pay | Admitting: *Deleted

## 2015-09-03 NOTE — Telephone Encounter (Signed)
Patty, Is this a Ardis Hughs patient? Is [facility] just banding for him?

## 2015-09-03 NOTE — Telephone Encounter (Signed)
The pt had an appt with Dr Havery Moros on 08/28/15 and no showed.  The son is calling to get another appt scheduled.  They never received the message regarding the appt.

## 2015-09-03 NOTE — Telephone Encounter (Signed)
Spoke with patient's son and offered OV on 09/09/15 with Dr. Havery Moros. He states his mother would prefer a female MD. Scheduled with Dr. Silverio Decamp on 10/16/15 at 1:30 PM.

## 2015-09-03 NOTE — Telephone Encounter (Signed)
No he is not Ardis Hughs I think that Dr Burr Medico just contacted Dr Ardis Hughs about her, Dr Ardis Hughs recommended [facility], Pyrtle or Carlean Purl and Dr Havery Moros had the soonest spot.

## 2015-09-03 NOTE — Telephone Encounter (Signed)
"  I'm calling for my mom as instructed by Dr. Burr Medico.  When she saw her a GI referral was made and it's been nine days and we haven't heard anything from [facility].  That place sucks."   This nurse called [facility].  Spoke with [employer].  "No referral is in their work [facility] for this patient.  Was able to view request but current order needs to be changed or new order entered.  Section that ask Referral "To Dept. Spec" must have "[facility]" or request is floating never entering [employer]'s work Que."  Can also add a provider name if there is a preference.  Will notify provider.

## 2015-09-03 NOTE — Telephone Encounter (Signed)
Spoke with Precious Bard, RN for Dr. Ardis Hughs.  Informed RN that pt's son called about pt's appt with GI since he has not heard from the office.  Gave Patti correct son's phone number to call.   Precious Bard stated she would relay message to Dr. Doyne Keel nurse to call pt's son. Call son and informed him that GI office will contact him for pt's appt.   Son voiced understanding.

## 2015-09-09 ENCOUNTER — Ambulatory Visit: Payer: Self-pay | Admitting: Gastroenterology

## 2015-09-25 MED FILL — TAMOXIFEN 20 MG TABLET: 20 | 90 days supply | Qty: 90 | Fill #3

## 2015-10-16 ENCOUNTER — Encounter: Payer: Self-pay | Admitting: Gastroenterology

## 2015-10-16 ENCOUNTER — Ambulatory Visit (INDEPENDENT_AMBULATORY_CARE_PROVIDER_SITE_OTHER): Payer: Self-pay | Admitting: Gastroenterology

## 2015-10-16 VITALS — BP 124/90 | HR 80 | Ht 60.5 in | Wt 178.0 lb

## 2015-10-16 DIAGNOSIS — K602 Anal fissure, unspecified: Secondary | ICD-10-CM

## 2015-10-16 DIAGNOSIS — Z1211 Encounter for screening for malignant neoplasm of colon: Secondary | ICD-10-CM

## 2015-10-16 MED ORDER — NA SULFATE-K SULFATE-MG SULF 17.5-3.13-1.6 GM/177ML PO SOLN: 1.0000 | Freq: Once | ORAL | Status: DC

## 2015-10-16 MED ORDER — AMBULATORY NON FORMULARY MEDICATION: Status: DC

## 2015-10-16 NOTE — Progress Notes (Signed)
Angela [name]    [id number]    08/19/1965  Primary Care Physician:Feng, Krista Blue, MD  Referring Physician: [name], MD  Chief complaint:  Rectal pain, hemorrhoids  HPI: 50 year old female with history of breast cancer here with complaints of anorectal pain. She is accompanied by her son. She has pain in the rectum after every bowel movement, feels like a sharp pain and has discomfort throughout the day. She has used rectal Hydro cortisone with no significant improvement. He also reports having intermittent bright red blood when she wipes after a bowel movement. She has also noticed some tissue protrude that with use by itself. She hasn't had a screening colonoscopy. Denies any nausea, vomiting, melena or abdominal pain. No family history of colon cancer.     Outpatient Encounter Prescriptions as of 10/16/2015  Medication Sig  . fish oil-omega-3 fatty acids 1000 MG capsule Take 2 g by mouth daily.  . Multiple Vitamins-Minerals (MULTIVITAMIN PO) Take 1 tablet by mouth daily.  . NON FORMULARY Risek 40mg  as needed for pain  . tamoxifen (NOLVADEX) 20 MG tablet TAKE 1 TABLET BY MOUTH ONCE DAILY  . AMBULATORY NON FORMULARY MEDICATION Medication Name: Nitroglycerin Ointment 0.125 %  Every 6-8 hours pea sized amount per rectum  . [DISCONTINUED] hydrocortisone cream 1 % Apply topically 2 (two) times daily.  . [DISCONTINUED] hydrocortisone cream 1 % Apply to affected area 3 times daily  . [DISCONTINUED] nystatin cream (MYCOSTATIN) Apply to affected area 3 times daily   No facility-administered encounter medications on file as of 10/16/2015.    Allergies as of 10/16/2015  . (No Known Allergies)    Past Medical History  Diagnosis Date  . Breast cancer ([facility]) 06/18/2010    stage II  . Stomach ulcer 07/04/2012    Past Surgical History  Procedure Laterality Date  . Mastectomy with axillary lymph node dissection Right 06/18/2010    T3N1    Family History  Problem Relation Age of  Onset  . Breast cancer Cousin 30    Social History   Social History  . Marital Status: Widowed    Spouse Name: N/A  . Number of Children: 1  . Years of Education: N/A   Occupational History  . Not on file.   Social History Main Topics  . Smoking status: Never Smoker   . Smokeless tobacco: Never Used  . Alcohol Use: No  . Drug Use: No  . Sexual Activity: Not Currently   Other Topics Concern  . Not on file   Social History Narrative      Review of systems: Review of Systems  Constitutional: Negative for fever and chills.  HENT: Negative.   Eyes: Negative for blurred vision.  Respiratory: Negative for cough, shortness of breath and wheezing.   Cardiovascular: Negative for chest pain and palpitations.  Gastrointestinal: as per HPI Genitourinary: Negative for dysuria, urgency, frequency and hematuria.  Musculoskeletal: Negative for myalgias, back pain and joint pain.  Skin: Negative for itching and rash.  Neurological: Negative for dizziness, tremors, focal weakness, seizures and loss of consciousness.  Endo/Heme/Allergies: Negative for environmental allergies.  Psychiatric/Behavioral: Negative for depression, suicidal ideas and hallucinations.  All other systems reviewed and are negative.   Physical Exam: Filed Vitals:   10/16/15 1316  BP: 124/90  Pulse: 80   Gen:      No acute distress HEENT:  EOMI, sclera anicteric Neck:     No masses; no thyromegaly Lungs:  Clear to auscultation bilaterally; normal respiratory effort CV:         Regular rate and rhythm; no murmurs Abd:      + bowel sounds; soft, non-tender; no palpable masses, no distension Ext:    No edema; adequate peripheral perfusion Skin:      Warm and dry; no rash Neuro: alert and oriented x 3 Psych: normal mood and affect Rectal exam: Increased anal sphincter tone and tenderness concerning for anal fissure. No external hemorrhoids. Did not perform Anoscopy  Data Reviewed: Reviewed chart in  epic   Assessment and Plan/Recommendations: 50 year old female with history of breast cancer here with complaints of hemorrhoids and rectal pain likely due to anal fissure Advised patient to start using 0.125% rectal nitroglycerin Q8 hours for next 6-8 weeks We'll schedule for screening colonoscopy The risks and benefits as well as alternatives of endoscopic procedure(s) have been discussed and reviewed. All questions answered. The patient agrees to proceed. Can consider scheduling for hemorrhoidal band ligation after the colonoscopy    K. Denzil Magnuson , MD [phone] Mon-Fri 8a-5p [phone] after 5p, weekends, holidays  CC: [name], MD

## 2015-10-16 NOTE — Patient Instructions (Addendum)
You have been scheduled for a colonoscopy. Please follow written instructions given to you at your visit today.  Please pick up your prep supplies at the pharmacy within the next 1-3 days. If you use inhalers (even only as needed), please bring them with you on the day of your procedure. Your physician has requested that you go to www.startemmi.com and enter the access code given to you at your visit today. This web site gives a general overview about your procedure. However, you should still follow specific instructions given to you by our office regarding your preparation for the procedure.  We have given you a printed prescription of Nitroglycerin ointment

## 2015-11-07 ENCOUNTER — Encounter: Payer: Self-pay | Admitting: Gastroenterology

## 2015-11-07 ENCOUNTER — Ambulatory Visit (AMBULATORY_SURGERY_CENTER): Payer: Self-pay | Admitting: Gastroenterology

## 2015-11-07 VITALS — BP 144/77 | HR 77 | Temp 98.6°F | Resp 15 | Wt 178.0 lb

## 2015-11-07 DIAGNOSIS — Z1211 Encounter for screening for malignant neoplasm of colon: Secondary | ICD-10-CM

## 2015-11-07 DIAGNOSIS — K602 Anal fissure, unspecified: Secondary | ICD-10-CM

## 2015-11-07 MED ORDER — SODIUM CHLORIDE 0.9 % IV SOLN: 500.0000 mL | INTRAVENOUS | Status: DC

## 2015-11-07 NOTE — Patient Instructions (Signed)
YOU HAD AN ENDOSCOPIC PROCEDURE TODAY AT THE [facility]:   Refer to the procedure report that was given to you for any specific questions about what was found during the examination.  If the procedure report does not answer your questions, please call your gastroenterologist to clarify.  If you requested that your care partner not be given the details of your procedure findings, then the procedure report has been included in a sealed envelope for you to review at your convenience later.  YOU SHOULD EXPECT: Some feelings of bloating in the abdomen. Passage of more gas than usual.  Walking can help get rid of the air that was put into your GI tract during the procedure and reduce the bloating. If you had a lower endoscopy (such as a colonoscopy or flexible sigmoidoscopy) you may notice spotting of blood in your stool or on the toilet paper. If you underwent a bowel prep for your procedure, you may not have a normal bowel movement for a few days.  Please Note:  You might notice some irritation and congestion in your nose or some drainage.  This is from the oxygen used during your procedure.  There is no need for concern and it should clear up in a day or so.  SYMPTOMS TO REPORT IMMEDIATELY:   Following lower endoscopy (colonoscopy or flexible sigmoidoscopy):  Excessive amounts of blood in the stool  Significant tenderness or worsening of abdominal pains  Swelling of the abdomen that is new, acute  Fever of 100F or higher  For urgent or emergent issues, a gastroenterologist can be reached at any hour by calling [phone].   DIET: Your first meal following the procedure should be a small meal and then it is ok to progress to your normal diet. Heavy or fried foods are harder to digest and may make you feel nauseous or bloated.  Likewise, meals heavy in dairy and vegetables can increase bloating.  Drink plenty of fluids but you should avoid alcoholic beverages for 24  hours.  ACTIVITY:  You should plan to take it easy for the rest of today and you should NOT DRIVE or use heavy machinery until tomorrow (because of the sedation medicines used during the test).    FOLLOW UP: Our staff will call the number listed on your records the next business day following your procedure to check on you and address any questions or concerns that you may have regarding the information given to you following your procedure. If we do not reach you, we will leave a message.  However, if you are feeling well and you are not experiencing any problems, there is no need to return our call.  We will assume that you have returned to your regular daily activities without incident.  If any biopsies were taken you will be contacted by phone or by letter within the next 1-3 weeks.  Please call us at [phone] if you have not heard about the biopsies in 3 weeks.    SIGNATURES/CONFIDENTIALITY: You and/or your care partner have signed paperwork which will be entered into your electronic medical record.  These signatures attest to the fact that that the information above on your After Visit Summary has been reviewed and is understood.  Full responsibility of the confidentiality of this discharge information lies with you and/or your care-partner.  Thank you for letting us take care of your healthcare needs today. 

## 2015-11-07 NOTE — Progress Notes (Signed)
A and O x3. Report to RN. Tolerated MAC anesthesia well. 

## 2015-11-07 NOTE — Op Note (Signed)
[facility] Patient Name: Angela [name] Procedure Date: 11/07/2015 2:44 PM MRN: [id number] Endoscopist: Mauri Pole , MD Age: 50 Referring MD:  Date of Birth: [birth year] Gender: Female Procedure:                Colonoscopy Indications:              Screening for colorectal malignant neoplasm Medicines:                Monitored Anesthesia Care Procedure:                Pre-Anesthesia Assessment:                           - Prior to the procedure, a History and Physical                            was performed, and patient medications and                            allergies were reviewed. The patient's tolerance of                            previous anesthesia was also reviewed. The risks                            and benefits of the procedure and the sedation                            options and risks were discussed with the patient.                            All questions were answered, and informed consent                            was obtained. Prior Anticoagulants: The patient has                            taken no previous anticoagulant or antiplatelet                            agents. ASA Grade Assessment: II - A patient with                            mild systemic disease. After reviewing the risks                            and benefits, the patient was deemed in                            satisfactory condition to undergo the procedure.                           After obtaining informed consent, the colonoscope  was passed under direct vision. Throughout the                            procedure, the patient's blood pressure, pulse, and                            oxygen saturations were monitored continuously. The                            Model CF-HQ190L [id number]) scope was introduced                            through the anus and advanced to the the terminal                            ileum, with identification of the  appendiceal                            orifice and IC valve. The colonoscopy was performed                            without difficulty. The patient tolerated the                            procedure well. The quality of the bowel                            preparation was excellent. The terminal ileum,                            ileocecal valve, appendiceal orifice, and rectum                            were photographed. Scope In: 2:49:49 PM Scope Out: 3:02:18 PM Scope Withdrawal Time: 0 hours 6 minutes 58 seconds  Total Procedure Duration: 0 hours 12 minutes 29 seconds  Findings:                 The perianal and digital rectal examinations were                            normal.                           The colon (entire examined portion) appeared normal. Complications:            No immediate complications. Estimated Blood Loss:     Estimated blood loss: none. Impression:               - The entire examined colon is normal.                           - No specimens collected. Recommendation:           - Patient has a contact number available for  emergencies. The signs and symptoms of potential                            delayed complications were discussed with the                            patient. Return to normal activities tomorrow.                            Written discharge instructions were provided to the                            patient.                           - Resume previous diet.                           - Continue present medications.                           - Repeat colonoscopy in 10 years for screening                            purposes.                           - Return to GI clinic PRN. Mauri Pole, MD 11/07/2015 3:06:18 PM This report has been signed electronically.

## 2015-11-07 NOTE — Progress Notes (Addendum)
Preop called to start IV - pt difficult IV stick, multiple cotton gauze on left hand. Verified Pt with Opal Sidles RN and Lanelle Bal RN and introduced to daughter in Sports coach and explained IV needed for anesthesia and emergency medications if needed. Daughter in law explained to pt, agreed. Verified allergies. Attempted left wrist, # 22 gauge - good blood return, unable to thread IV catheter. Removed IV and applied pressure gauze. LBeesonCRNA  Attempted above that site with # 22 catheter, with no IV success. Pressure guaze applied. Consulted RN about warm compress for left area, unable to manually feel or visualize IV access. Consulted Pt and daughter in law about using left foot.Daughter in law agreed with Pt. Confirmed Pt agreed. Daughter in law became upset and asked for her husband to come back in preop room. Attempted IV access in left foot with # 24 gauge x 1. Son walked up to bedside up set and asked me to stop. Which I did. Demanded Dr Peggye Ley. I tried to explain an IV was needed. Pt was dehyradated and trying to obtain IV access was difficult. I allowed Dr Peggye Ley to handle conernes / questions. Son upset and stated " We should be able to get her IV, being a [facility]". Started explained how he knows about a lot about " soccer " and we should know about how to get an IV . I did not understand this comment and anger, therefore -  I removed myself from the bedside Dr Peggye Ley there. Loletta Specter, CRNA provided anesthesia for the patient.  LB,CRNA

## 2015-11-10 ENCOUNTER — Telehealth: Payer: Self-pay | Admitting: *Deleted

## 2015-11-10 NOTE — Telephone Encounter (Signed)
  Follow up Call-  Call back number 11/07/2015  Post procedure Call Back phone  # [phone]  Permission to leave phone message Yes    Spoke with son Patient questions:  Do you have a fever, pain , or abdominal swelling? No. Pain Score  0 *  Have you tolerated food without any problems? Yes.    Have you been able to return to your normal activities? Yes.    Do you have any questions about your discharge instructions: Diet   No. Medications  No. Follow up visit  No.  Do you have questions or concerns about your Care? No.  Actions: * If pain score is 4 or above: No action needed, pain <4.

## 2015-12-29 ENCOUNTER — Other Ambulatory Visit: Payer: Self-pay | Admitting: Hematology

## 2015-12-29 DIAGNOSIS — C50919 Malignant neoplasm of unspecified site of unspecified female breast: Secondary | ICD-10-CM

## 2015-12-29 MED FILL — TAMOXIFEN 20 MG TABLET: 20 | 60 days supply | Qty: 60 | Fill #4

## 2016-02-19 ENCOUNTER — Ambulatory Visit
Admission: RE | Admit: 2016-02-19 | Discharge: 2016-02-19 | Disposition: A | Discharge status: Home / Self Care | Payer: No Typology Code available for payment source | Source: Ambulatory Visit | Attending: Hematology | Admitting: Hematology

## 2016-02-19 DIAGNOSIS — Z1231 Encounter for screening mammogram for malignant neoplasm of breast: Secondary | ICD-10-CM

## 2016-02-23 ENCOUNTER — Telehealth: Payer: Self-pay | Admitting: Hematology

## 2016-02-23 ENCOUNTER — Other Ambulatory Visit: Payer: Self-pay | Admitting: Hematology

## 2016-02-23 ENCOUNTER — Ambulatory Visit (HOSPITAL_BASED_OUTPATIENT_CLINIC_OR_DEPARTMENT_OTHER): Payer: Self-pay | Admitting: Hematology

## 2016-02-23 ENCOUNTER — Encounter: Payer: Self-pay | Admitting: Hematology

## 2016-02-23 ENCOUNTER — Other Ambulatory Visit (HOSPITAL_BASED_OUTPATIENT_CLINIC_OR_DEPARTMENT_OTHER): Payer: Self-pay

## 2016-02-23 VITALS — BP 133/74 | HR 75 | Temp 98.0°F | Resp 18 | Ht 60.5 in | Wt 179.1 lb

## 2016-02-23 DIAGNOSIS — C50919 Malignant neoplasm of unspecified site of unspecified female breast: Secondary | ICD-10-CM

## 2016-02-23 DIAGNOSIS — K649 Unspecified hemorrhoids: Secondary | ICD-10-CM

## 2016-02-23 DIAGNOSIS — C50911 Malignant neoplasm of unspecified site of right female breast: Secondary | ICD-10-CM

## 2016-02-23 DIAGNOSIS — C773 Secondary and unspecified malignant neoplasm of axilla and upper limb lymph nodes: Secondary | ICD-10-CM

## 2016-02-23 DIAGNOSIS — Z170 Estrogen receptor positive status [ER+]: Secondary | ICD-10-CM

## 2016-02-23 LAB — COMPREHENSIVE METABOLIC PANEL
ALT: 22 U/L (ref 0–55)
ANION GAP: 10 meq/L (ref 3–11)
AST: 23 U/L (ref 5–34)
Albumin: 3.6 g/dL (ref 3.5–5.0)
Alkaline Phosphatase: 79 U/L (ref 40–150)
BUN: 10.1 mg/dL (ref 7.0–26.0)
CALCIUM: 9.1 mg/dL (ref 8.4–10.4)
CHLORIDE: 106 meq/L (ref 98–109)
CO2: 24 meq/L (ref 22–29)
Creatinine: 0.7 mg/dL (ref 0.6–1.1)
Glucose: 117 mg/dl (ref 70–140)
POTASSIUM: 4.1 meq/L (ref 3.5–5.1)
Sodium: 140 mEq/L (ref 136–145)
Total Bilirubin: 0.4 mg/dL (ref 0.20–1.20)
Total Protein: 6.9 g/dL (ref 6.4–8.3)

## 2016-02-23 LAB — CBC WITH DIFFERENTIAL/PLATELET
BASO%: 0.6 % (ref 0.0–2.0)
BASOS ABS: 0 10*3/uL (ref 0.0–0.1)
EOS%: 3.4 % (ref 0.0–7.0)
Eosinophils Absolute: 0.2 10*3/uL (ref 0.0–0.5)
HEMATOCRIT: 36.8 % (ref 34.8–46.6)
HGB: 12.5 g/dL (ref 11.6–15.9)
LYMPH%: 33.4 % (ref 14.0–49.7)
MCH: 29.9 pg (ref 25.1–34.0)
MCHC: 34 g/dL (ref 31.5–36.0)
MCV: 87.7 fL (ref 79.5–101.0)
MONO#: 0.8 10*3/uL (ref 0.1–0.9)
MONO%: 11.6 % (ref 0.0–14.0)
NEUT#: 3.3 10*3/uL (ref 1.5–6.5)
NEUT%: 51 % (ref 38.4–76.8)
PLATELETS: 196 10*3/uL (ref 145–400)
RBC: 4.19 10*6/uL (ref 3.70–5.45)
RDW: 12.2 % (ref 11.2–14.5)
WBC: 6.5 10*3/uL (ref 3.9–10.3)
lymph#: 2.2 10*3/uL (ref 0.9–3.3)

## 2016-02-23 MED ORDER — TAMOXIFEN CITRATE 20 MG PO TABS: 20.0000 mg | ORAL_TABLET | Freq: Every day | ORAL | 2 refills | Status: DC

## 2016-02-23 MED FILL — TAMOXIFEN 20 MG TABLET: 20 | 90 days supply | Qty: 90 | Fill #0

## 2016-02-23 NOTE — Progress Notes (Signed)
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    STAGE:  Stage III ( Invasive Ductal Carcinoma, ER+ Her2Neu+)  Right breast s/p mastectomy with ALND, 5.3 cm    PRIOR THERAPY: #1originally from [place]. Patient had her annual mammogram performed in November 2012 and she felt a mass. In 05/08/2011 patient underwent a right modified radical mastectomy with level II axillary clearance. Her final pathology revealed a invasive ductal carcinoma that was grade 3 measuring 5.3 cm in greatest dimension. All margins were negative. She was noted to have level lymphovascular invasion. Patient had 22 lymph nodes removed one was positive for metastatic disease without extracapsular extension.. Patient went on to receive adjuvant chemotherapy consisting of 6 cycles of Taxotere Adriamycin and Cytoxan. The tumor was ER positive PR positive HER-2/neu positive.She was then placed on tamoxifen 20 mg on a daily basis. She tolerated this quite well   CURRENT THERAPY: tamoxifen 20 mg daily since 2013  INTERVAL HISTORY: Angela [name] 50 y.o. female returns for followup visit today. She is accompanied today by her son F'nu and his [place] who both act as interpreters for [name].  She is doing well overall. She has been very compliant with tamoxifen, and tolerates well. She denies significant hot flash or others noticeable side effects. She has been eating pretty healthy, and walks for a few miles every day. According to her son, she used to work 15-16 hours a day, and only works for 7-8 hours a day here in the [place]. She has a mild fatigue sometime, but functions very well at home. She denies significant pain, dyspnea, or other symptoms. Her weight has been stable.   MEDICAL HISTORY: Past Medical History:  Diagnosis  Date  . Breast cancer ([facility]) 06/18/2010   stage II  . Stomach ulcer 07/04/2012    ALLERGIES:  has No Known Allergies.  MEDICATIONS:  Current Outpatient Prescriptions  Medication Sig Dispense Refill  . AMBULATORY NON FORMULARY MEDICATION Medication Name: Nitroglycerin Ointment 0.125 %  Every 6-8 hours pea sized amount per rectum 30 g 3  . fish oil-omega-3 fatty acids 1000 MG capsule Take 2 g by mouth daily.    . Multiple Vitamins-Minerals (MULTIVITAMIN PO) Take 1 tablet by mouth daily.    . NON FORMULARY Risek 54m as needed for pain    . tamoxifen (NOLVADEX) 20 MG tablet Take 1 tablet (20 mg total) by mouth daily. 90 tablet 2   No current facility-administered medications for this visit.     SURGICAL HISTORY:  Past Surgical History:  Procedure Laterality Date  . MASTECTOMY WITH AXILLARY LYMPH NODE DISSECTION Right 06/18/2010   T3N1    REVIEW OF SYSTEMS:  Pertinent items are noted in HPI.    PHYSICAL EXAMINATION: Blood pressure 133/74, pulse 75, temperature 98 F (36.7 C), temperature source Oral, resp. rate 18, height 5' 0.5" (1.537 m), weight 179 lb 1.6 oz (81.2 kg), SpO2 100 %. Body mass index is 34.4 kg/m.  ECOG PERFORMANCE STATUS: 0 - Asymptomatic  PHYSICAL EXAMINATION: General appearance: alert, cooperative, appears stated age and no distress Head: Normocephalic, without obvious abnormality, atraumatic Neck: no adenopathy, no carotid bruit, no JVD, supple, symmetrical, trachea midline and thyroid not enlarged, symmetric, no tenderness/mass/nodules Lymph nodes: Cervical, supraclavicular, and axillary nodes normal. Resp: clear to auscultation bilaterally Back: symmetric, no  curvature. ROM normal. No CVA tenderness. Cardio: regular rate and rhythm, S1, S2 normal, no murmur, click, rub or gallop GI: soft, non-tender; bowel sounds normal; no masses,  no organomegaly Extremities: extremities normal, atraumatic, no cyanosis or edema Neurologic: Alert and oriented X 3, normal  strength and tone. Normal symmetric reflexes. Normal coordination and gait Breasts: Right mastectomy, incision is well-healed without evidence of skin changes or local recurrence. Left breast without any palpable masses, skin changes of concern nipple changes or nipple discharge. Nothing palpable in either axilla.   LABORATORY DATA: CBC Latest Ref Rng & Units 02/23/2016 08/25/2015 02/17/2015  WBC 3.9 - 10.3 10e3/uL 6.5 5.5 5.7  Hemoglobin 11.6 - 15.9 g/dL 12.5 12.9 12.5  Hematocrit 34.8 - 46.6 % 36.8 37.2 37.0  Platelets 145 - 400 10e3/uL 196 193 194    CMP Latest Ref Rng & Units 02/23/2016 08/25/2015 02/17/2015  Glucose 70 - 140 mg/dl 117 139 112  BUN 7.0 - 26.0 mg/dL 10.1 11.4 11.7  Creatinine 0.6 - 1.1 mg/dL 0.7 0.8 0.7  Sodium 136 - 145 mEq/L 140 142 140  Potassium 3.5 - 5.1 mEq/L 4.1 4.2 4.1  Chloride 98 - 107 mEq/L - - -  CO2 22 - 29 mEq/L _0 Calcium 8.4 - 10.4 mg/dL 9.1 9.3 9.4  Total Protein 6.4 - 8.3 g/dL 6.9 7.1 6.7  Total Bilirubin 0.20 - 1.20 mg/dL 0.40 0.43 0.45  Alkaline Phos 40 - 150 U/L 79 74 74  AST 5 - 34 U/L _1 ALT 0 - 55 U/L _2 RADIOGRAPHIC STUDIES:  She had mammogram last week at GI, the report is not release yet   Bone density scan 01/10/2015 ASSESSMENT: Patient's diagnostic category is NORMAL by WHO Criteria.  ASSESSMENT: 50 year old perimenopause female with  1. stage III invasive ductal carcinoma ER positive HER-2/neu positive, diagnosed in 05/2011 - status post right mastectomy with axillary lymph node dissection for a 5.3 cm node-positive disease. Patient subsequently underwent adjuvant chemotherapy consisting of 6 cycles of Taxotere Adriamycin and Cytoxan. She did not receive anti-HER-2 therapy due to availability and financial issue. -She has been on adjuvant Tamoxifen 20 mg daily since mid year 2013. She has been tolerating it very well  without noticeable side effects. I discussed the option of continue tamoxifen for total of  10 years, versus change to aromatase inhibitor for additional 5 years if she is truly postmenopausal. I checked her [facility] and Estrogen level in 2016, she is still not in post menopause yet, we'll continue tamoxifen -She is 4.5 years out of her initial breast cancer diagnosis, we discussed that her risk of recurrence is less now. However late recurrence can be seen in ER positive breast cancer. We will continue monitoring. -Patient  is clinically doing well, lab, last mammogram and current physical exam revealed no evidence of recurrent disease -Lab results reviewed with patient, CBC and CMP are normal -I encouraged her to continue healthy diet and regular exercise. I encouraged her to do self breast exam.  2. Bone health -recent bone density scan was normal -I encouraged her to take calcium and vitamin D for bone health   3. Hemorroids -She had colonoscopy in 11/2015 -she will follow up with GI if needed   Follow up -continue tamoxifen -I'll see her back in 6 months with lab    All questions were answered. The patient knows to call the clinic with any problems, questions or concerns. We can  certainly see the patient much sooner if necessary.  I spent 20 minutes counseling the patient face to face. The total time spent in the appointment was 30 minutes.   Burr Medico, Desarie Feild,MD 02/23/2016

## 2016-02-23 NOTE — Telephone Encounter (Signed)
Avs report and appointment schedule given to patient, per 02/23/16 los. °

## 2016-02-24 LAB — LIPID PANEL
CHOL/HDL RATIO: 3.4 ratio (ref 0.0–4.4)
Cholesterol, Total: 182 mg/dL (ref 100–199)
HDL: 54 mg/dL (ref >39)
LDL CALC: 98 mg/dL (ref 0–99)
Triglycerides: 149 mg/dL (ref 0–149)
VLDL CHOLESTEROL CAL: 30 mg/dL (ref 5–40)

## 2016-02-25 ENCOUNTER — Telehealth: Payer: Self-pay | Admitting: *Deleted

## 2016-02-25 ENCOUNTER — Other Ambulatory Visit: Payer: Self-pay | Admitting: Hematology

## 2016-02-25 DIAGNOSIS — R928 Other abnormal and inconclusive findings on diagnostic imaging of breast: Secondary | ICD-10-CM

## 2016-02-25 NOTE — Telephone Encounter (Signed)
-----   Message from [name], MD sent at 02/25/2016  9:22 AM EDT ----- Please let pt's son know about her mammogram result, and breast center will contact them for additional mammo and [place].  [name]

## 2016-02-25 NOTE — Telephone Encounter (Signed)
Message given to son regarding asymetry in breast & need to further evaluate with u/s.  He expressed understanding & informed that he could call the [facility] to set up if he hasn't heard from them.

## 2016-02-25 NOTE — Telephone Encounter (Signed)
Pt's son called back & was upset, stating that he called the [facility] & they stated that they couldn't schedule test until order was placed.  He states that DR is not caring.  Informed that a message will be given to Dr Burr Medico in the am to order. Called son back & left message that orders were seen & not sure why he was told that orders were not done.  Will discuss with Dr Burr Medico in am.

## 2016-02-27 ENCOUNTER — Ambulatory Visit
Admission: RE | Admit: 2016-02-27 | Discharge: 2016-02-27 | Disposition: A | Discharge status: Home / Self Care | Payer: No Typology Code available for payment source | Source: Ambulatory Visit | Attending: Hematology | Admitting: Hematology

## 2016-02-27 DIAGNOSIS — R928 Other abnormal and inconclusive findings on diagnostic imaging of breast: Secondary | ICD-10-CM

## 2016-05-31 ENCOUNTER — Other Ambulatory Visit: Payer: Self-pay | Admitting: Internal Medicine

## 2016-05-31 MED ORDER — TAMOXIFEN CITRATE 20 MG PO TABS: 20.0000 mg | ORAL_TABLET | Freq: Every day | ORAL | 0 refills | Status: DC

## 2016-06-01 MED FILL — TAMOXIFEN 20 MG TABLET: 20 | 90 days supply | Qty: 90 | Fill #1

## 2016-08-16 NOTE — Progress Notes (Signed)
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    STAGE:  Stage III ( Invasive Ductal Carcinoma, ER+ Her2Neu+)  Right breast s/p mastectomy with ALND, 5.3 cm    PRIOR THERAPY: #1originally from [place]. Patient had her annual mammogram performed in November 2012 and she felt a mass. In 05/08/2011 patient underwent a right modified radical mastectomy with level II axillary clearance. Her final pathology revealed a invasive ductal carcinoma that was grade 3 measuring 5.3 cm in greatest dimension. All margins were negative. She was noted to have level lymphovascular invasion. Patient had 22 lymph nodes removed one was positive for metastatic disease without extracapsular extension.. Patient went on to receive adjuvant chemotherapy consisting of 6 cycles of Taxotere Adriamycin and Cytoxan. The tumor was ER positive PR positive HER-2/neu positive.She was then placed on tamoxifen 20 mg on a daily basis. She tolerated this quite well  CURRENT THERAPY: tamoxifen 20 mg daily since 2013  INTERVAL HISTORY: Angela [name] 51 y.o. female returns for followup visit today. She doesn't speak English, but her son translates for her. She is doing well today. She still has some fatigue and SOB with exertion such as walking up hills. She is very active throughout the day and takes care of herself at home. Her hemorrhoids are gone. She has been trying to eat healthier, more vegetables and less rice. Denies any other concerns.   MEDICAL HISTORY: Past Medical History:  Diagnosis Date  . Breast cancer ([facility]) 06/18/2010   stage II  . Stomach ulcer 07/04/2012    ALLERGIES:  has No Known Allergies.  MEDICATIONS:  Current Outpatient Prescriptions  Medication Sig Dispense Refill  . AMBULATORY NON FORMULARY MEDICATION  Medication Name: Nitroglycerin Ointment 0.125 %  Every 6-8 hours pea sized amount per rectum 30 g 3  . fish oil-omega-3 fatty acids 1000 MG capsule Take 2 g by mouth daily.    . Multiple Vitamins-Minerals (MULTIVITAMIN PO) Take 1 tablet by mouth daily.    . NON FORMULARY Risek 40mg as needed for pain    . tamoxifen (NOLVADEX) 20 MG tablet Take 1 tablet (20 mg total) by mouth daily. 90 tablet 1   No current facility-administered medications for this visit.     SURGICAL HISTORY:  Past Surgical History:  Procedure Laterality Date  . MASTECTOMY WITH AXILLARY LYMPH NODE DISSECTION Right 06/18/2010   T3N1    REVIEW OF SYSTEMS: Review of Systems  Constitutional: Positive for malaise/fatigue.  HENT: Negative.   Eyes: Negative.   Respiratory: Positive for shortness of breath (with exertion).   Cardiovascular: Negative.   Gastrointestinal: Negative.   Genitourinary: Negative.   Musculoskeletal: Negative.   Skin: Negative.   Neurological: Negative.   Endo/Heme/Allergies: Negative.   Psychiatric/Behavioral: Negative.   All other systems reviewed and are negative.  PHYSICAL EXAMINATION: Blood pressure (!) 152/87, pulse 81, temperature 97.8 F (36.6 C), temperature source Oral, resp. rate 18, height 5' 0.5" (1.537 m), weight 184 lb 3.2 oz (83.6 kg), SpO2 97 %. Body mass index is 35.38 kg/m.  ECOG PERFORMANCE STATUS: 0 - Asymptomatic  PHYSICAL EXAMINATION: General appearance: alert, cooperative, appears stated age and no distress Head: Normocephalic, without obvious abnormality, atraumatic Neck: no adenopathy, no carotid bruit, no JVD, supple, symmetrical, trachea midline and thyroid not enlarged, symmetric, no tenderness/mass/nodules Lymph nodes: Cervical, supraclavicular, and   axillary nodes normal. Resp: clear to auscultation bilaterally Back: symmetric, no curvature. ROM normal. No CVA tenderness. Cardio: regular rate and rhythm, S1, S2 normal, no murmur, click, rub or gallop GI:  soft, non-tender; bowel sounds normal; no masses,  no organomegaly Extremities: extremities normal, atraumatic, no cyanosis or edema Neurologic: Alert and oriented X 3, normal strength and tone. Normal symmetric reflexes. Normal coordination and gait Breasts: Right mastectomy, incision is well-healed without evidence of skin changes or local recurrence. Left breast without any palpable masses, skin changes of concern nipple changes or nipple discharge. Nothing palpable in either axilla.  LABORATORY DATA: CBC Latest Ref Rng & Units 08/20/2016 02/23/2016 08/25/2015  WBC 3.9 - 10.3 10e3/uL 6.4 6.5 5.5  Hemoglobin 11.6 - 15.9 g/dL 13.4 12.5 12.9  Hematocrit 34.8 - 46.6 % 38.6 36.8 37.2  Platelets 145 - 400 10e3/uL 217 196 193    CMP Latest Ref Rng & Units 08/20/2016 02/23/2016 08/25/2015  Glucose 70 - 140 mg/dl 113 117 139  BUN 7.0 - 26.0 mg/dL 10.3 10.1 11.4  Creatinine 0.6 - 1.1 mg/dL 0.8 0.7 0.8  Sodium 136 - 145 mEq/L 139 140 142  Potassium 3.5 - 5.1 mEq/L 4.2 4.1 4.2  Chloride 98 - 107 mEq/L - - -  CO2 22 - 29 mEq/L _0 Calcium 8.4 - 10.4 mg/dL 9.9 9.1 9.3  Total Protein 6.4 - 8.3 g/dL 7.7 6.9 7.1  Total Bilirubin 0.20 - 1.20 mg/dL 0.55 0.40 0.43  Alkaline Phos 40 - 150 U/L 96 79 74  AST 5 - 34 U/L _1 ALT 0 - 55 U/L 32 22 22   RADIOGRAPHIC STUDIES:  Digital Screening Mammogram L breast 02/19/2016 IMPRESSION: Further evaluation is suggested for possible asymmetry in the left Breast.  Diagnostic Mammogram L brest 02/27/2016 IMPRESSION: 1. There are 2 adjacent masses in the upper-outer left breast, both of which likely represent benign lymph nodes, though only 1 was definitely identified sonographically.  2.  No mammographic evidence of left breast malignancy.  Bone density scan 01/10/2015 ASSESSMENT: Patient's diagnostic category is NORMAL by WHO Criteria.  ASSESSMENT: 51 y.o. premenopausal female with  1. stage III invasive ductal carcinoma ER positive HER-2/neu  positive, diagnosed in 05/2011 - status post right mastectomy with axillary lymph node dissection for a 5.3 cm node-positive disease. Patient subsequently underwent adjuvant chemotherapy consisting of 6 cycles of Taxotere Adriamycin and Cytoxan. She did not receive anti-HER-2 therapy due to availability and financial issue. -She has been on adjuvant Tamoxifen 20 mg daily since mid year 2013. She has been tolerating it very well  without noticeable side effects. I discussed the option of continue tamoxifen for total of 10 years, versus change to aromatase inhibitor for additional 5 years if she is truly postmenopausal. I checked her [facility] and Estrogen level in 2016, she is still not in post menopause yet, we'll continue tamoxifen -She is 4.5 years out of her initial breast cancer diagnosis, we discussed that her risk of recurrence is less now. However late recurrence can be seen in ER positive breast cancer. We will continue monitoring. -Patient  is clinically doing well, lab reviewed, CBC and CMP are WNL,  physical exam today is unremarkable, no clinical concern for recurrent disease -Her last mammogram and ultrasound in September 2017 showed 2 adjacent [place] metastasis in the upper outer left breast, likely benign lymph nodes, 6 month follow-up was recommended. I'll schedule for her to be done in the next few weeks. -I encouraged  her to continue healthy diet and regular exercise. I encouraged her to do self breast exam.  2. Bone health -recent bone density scan was normal -I encouraged her to take calcium and vitamin D for bone health   3. Hemorroids -She had colonoscopy in 11/2015 -she will follow up with GI if needed -Her symptoms have resolved now  4. Lipid and BP issue -She is slightly hypertensive today. No past medical history of hypertension. I encouraged her to check her blood pressure at home -I checked her cholesterol in September 2017, which are in the upper normal limits -Her son will  bring her to urgent care to repeat her cholesterol level   Plan -continue tamoxifen. Refilled today -Ordered mammogram for this month.  -I'll see her back in 6 months with lab  -I encouraged patient to establish care with her primary care physician.   All questions were answered. The patient knows to call the clinic with any problems, questions or concerns. We can certainly see the patient much sooner if necessary.  I spent 20 minutes counseling the patient face to face. The total time spent in the appointment was 30 minutes.  This document serves as a record of services personally performed by [name], MD. It was created on her behalf by Martinique Casey, a trained medical scribe. The creation of this record is based on the scribe's personal observations and the provider's statements to them. This document has been checked and approved by the attending provider.  I have reviewed the above documentation for accuracy and completeness and I agree with the above.  Burr Medico, Sha Amer,MD 08/20/2016

## 2016-08-20 ENCOUNTER — Encounter: Payer: Self-pay | Admitting: Hematology

## 2016-08-20 ENCOUNTER — Ambulatory Visit (HOSPITAL_BASED_OUTPATIENT_CLINIC_OR_DEPARTMENT_OTHER): Payer: Self-pay | Admitting: Hematology

## 2016-08-20 ENCOUNTER — Other Ambulatory Visit (HOSPITAL_BASED_OUTPATIENT_CLINIC_OR_DEPARTMENT_OTHER): Payer: Self-pay

## 2016-08-20 ENCOUNTER — Telehealth: Payer: Self-pay | Admitting: Hematology

## 2016-08-20 VITALS — BP 152/87 | HR 81 | Temp 97.8°F | Resp 18 | Ht 60.5 in | Wt 184.2 lb

## 2016-08-20 DIAGNOSIS — N6321 Unspecified lump in the left breast, upper outer quadrant: Secondary | ICD-10-CM

## 2016-08-20 DIAGNOSIS — Z170 Estrogen receptor positive status [ER+]: Secondary | ICD-10-CM

## 2016-08-20 DIAGNOSIS — C773 Secondary and unspecified malignant neoplasm of axilla and upper limb lymph nodes: Secondary | ICD-10-CM

## 2016-08-20 DIAGNOSIS — C50911 Malignant neoplasm of unspecified site of right female breast: Secondary | ICD-10-CM

## 2016-08-20 LAB — CBC WITH DIFFERENTIAL/PLATELET
BASO%: 0.3 % (ref 0.0–2.0)
Basophils Absolute: 0 10*3/uL (ref 0.0–0.1)
EOS ABS: 0.1 10*3/uL (ref 0.0–0.5)
EOS%: 1.3 % (ref 0.0–7.0)
HCT: 38.6 % (ref 34.8–46.6)
HGB: 13.4 g/dL (ref 11.6–15.9)
LYMPH%: 28.8 % (ref 14.0–49.7)
MCH: 30 pg (ref 25.1–34.0)
MCHC: 34.7 g/dL (ref 31.5–36.0)
MCV: 86.4 fL (ref 79.5–101.0)
MONO#: 0.5 10*3/uL (ref 0.1–0.9)
MONO%: 8.2 % (ref 0.0–14.0)
NEUT%: 61.4 % (ref 38.4–76.8)
NEUTROS ABS: 3.9 10*3/uL (ref 1.5–6.5)
PLATELETS: 217 10*3/uL (ref 145–400)
RBC: 4.47 10*6/uL (ref 3.70–5.45)
RDW: 12.2 % (ref 11.2–14.5)
WBC: 6.4 10*3/uL (ref 3.9–10.3)
lymph#: 1.8 10*3/uL (ref 0.9–3.3)

## 2016-08-20 LAB — COMPREHENSIVE METABOLIC PANEL
ALK PHOS: 96 U/L (ref 40–150)
ALT: 32 U/L (ref 0–55)
AST: 31 U/L (ref 5–34)
Albumin: 4.2 g/dL (ref 3.5–5.0)
Anion Gap: 11 mEq/L (ref 3–11)
BILIRUBIN TOTAL: 0.55 mg/dL (ref 0.20–1.20)
BUN: 10.3 mg/dL (ref 7.0–26.0)
CO2: 24 mEq/L (ref 22–29)
Calcium: 9.9 mg/dL (ref 8.4–10.4)
Chloride: 104 mEq/L (ref 98–109)
Creatinine: 0.8 mg/dL (ref 0.6–1.1)
GLUCOSE: 113 mg/dL (ref 70–140)
Potassium: 4.2 mEq/L (ref 3.5–5.1)
SODIUM: 139 meq/L (ref 136–145)
TOTAL PROTEIN: 7.7 g/dL (ref 6.4–8.3)

## 2016-08-20 MED ORDER — TAMOXIFEN CITRATE 20 MG PO TABS: 20.0000 mg | ORAL_TABLET | Freq: Every day | ORAL | 1 refills | Status: DC

## 2016-08-20 NOTE — Telephone Encounter (Signed)
Appointments scheduled per 3.16.18 LOS. Patient given AVS report and calendars with future scheduled appointments.  °

## 2016-08-21 ENCOUNTER — Ambulatory Visit: Payer: Self-pay

## 2016-08-21 LAB — CANCER ANTIGEN 27.29: CA 27.29: 33.7 U/mL (ref 0.0–38.6)

## 2016-08-24 MED FILL — TAMOXIFEN 20 MG TABLET: 20 | 90 days supply | Qty: 90 | Fill #2

## 2016-08-26 ENCOUNTER — Ambulatory Visit (INDEPENDENT_AMBULATORY_CARE_PROVIDER_SITE_OTHER): Payer: Self-pay | Admitting: Physician Assistant

## 2016-08-26 VITALS — BP 120/80 | HR 84 | Temp 98.5°F | Resp 17 | Ht 61.25 in | Wt 182.0 lb

## 2016-08-26 DIAGNOSIS — Z13220 Encounter for screening for lipoid disorders: Secondary | ICD-10-CM

## 2016-08-26 DIAGNOSIS — Z131 Encounter for screening for diabetes mellitus: Secondary | ICD-10-CM

## 2016-08-26 DIAGNOSIS — Z136 Encounter for screening for cardiovascular disorders: Secondary | ICD-10-CM

## 2016-08-26 DIAGNOSIS — Z1329 Encounter for screening for other suspected endocrine disorder: Secondary | ICD-10-CM

## 2016-08-26 DIAGNOSIS — R7303 Prediabetes: Secondary | ICD-10-CM

## 2016-08-26 DIAGNOSIS — R5381 Other malaise: Secondary | ICD-10-CM

## 2016-08-26 LAB — GLUCOSE, POCT (MANUAL RESULT ENTRY): POC Glucose: 81 mg/dL (ref 70–99)

## 2016-08-26 NOTE — Progress Notes (Signed)
08/29/2016 7:46 PM   DOB: [birth year] / MRN: [id number]  SUBJECTIVE:  Angela [name] is a 51 y.o. female with a history of breast cancer though to be in remission presenting at the request of her oncologist for a lipid panel.  She is a never smoker. No history of HTN and diabetes. Her son is with her today and takes very good care of her.    He tells me he checks her BP often and the top number averages 832 systolic and he is not certain of the diastolic numbers.  She has mammograms yearly.  She has no history of pap and does not want this today.  She had a negative colonoscopy in mid 2017 with a 10 year return rec.  Her son would like [place] to do an EKG to establish a baseline.  He tells me that she will complain of SOB with walking up hills but there has been no sudden changes in this. She has never had and echo in [facility].  She has never had a thyroid screening.   She has No Known Allergies.   She  has a past medical history of Breast cancer ([place]) (06/18/2010) and Stomach ulcer (07/04/2012).    She  reports that she has never smoked. She has never used smokeless tobacco. She reports that she does not drink alcohol or use drugs. She  reports that she does not currently engage in sexual activity. The patient  has a past surgical history that includes Mastectomy with axillary lymph node dissection (Right, 06/18/2010).  Her family history includes Breast cancer (age of onset: 37) in her cousin.  Review of Systems  Constitutional: Negative for fever.  Cardiovascular: Negative for chest pain, palpitations and leg swelling.  Gastrointestinal: Negative for nausea.  Musculoskeletal: Negative for falls.  Skin: Negative for rash.  Neurological: Negative for dizziness and focal weakness.    The problem list and medications were reviewed and updated by myself where necessary and exist elsewhere in the encounter.   OBJECTIVE:  BP 120/80 (BP Location: Right Arm, Patient Position: Sitting, Cuff Size: Large)    Pulse 84   Temp 98.5 F (36.9 C) (Oral)   Resp 17   Ht 5' 1.25" (1.556 m)   Wt 182 lb (82.6 kg)   SpO2 98%   BMI 34.11 kg/m   Physical Exam  Constitutional: She is oriented to person, place, and time.  Cardiovascular: Normal rate and regular rhythm.   Pulmonary/Chest: Effort normal and breath sounds normal.  Musculoskeletal: Normal range of motion. She exhibits no edema, tenderness or deformity.  Neurological: She is alert and oriented to person, place, and time. She has normal reflexes.   Recent Results (from the past 2160 hour(s))  CBC with Differential     Status: None   Collection Time: 08/20/16 12:29 PM  Result Value Ref Range   WBC 6.4 3.9 - 10.3 10e3/uL   NEUT# 3.9 1.5 - 6.5 10e3/uL   HGB 13.4 11.6 - 15.9 g/dL   HCT 38.6 34.8 - 46.6 %   Platelets 217 145 - 400 10e3/uL   MCV 86.4 79.5 - 101.0 fL   MCH 30.0 25.1 - 34.0 pg   MCHC 34.7 31.5 - 36.0 g/dL   RBC 4.47 3.70 - 5.45 10e6/uL   RDW 12.2 11.2 - 14.5 %   lymph# 1.8 0.9 - 3.3 10e3/uL   MONO# 0.5 0.1 - 0.9 10e3/uL   Eosinophils Absolute 0.1 0.0 - 0.5 10e3/uL   Basophils Absolute 0.0 0.0 - 0.1  10e3/uL   NEUT% 61.4 38.4 - 76.8 %   LYMPH% 28.8 14.0 - 49.7 %   MONO% 8.2 0.0 - 14.0 %   EOS% 1.3 0.0 - 7.0 %   BASO% 0.3 0.0 - 2.0 %  Comprehensive metabolic panel     Status: None   Collection Time: 08/20/16 12:29 PM  Result Value Ref Range   Sodium 139 136 - 145 mEq/L   Potassium 4.2 3.5 - 5.1 mEq/L   Chloride 104 98 - 109 mEq/L   CO2 24 22 - 29 mEq/L   Glucose 113 70 - 140 mg/dl    Comment: Glucose reference range is for nonfasting patients. Fasting glucose reference range is 70- 100.   BUN 10.3 7.0 - 26.0 mg/dL   Creatinine 0.8 0.6 - 1.1 mg/dL   Total Bilirubin 0.55 0.20 - 1.20 mg/dL   Alkaline Phosphatase 96 40 - 150 U/L   AST 31 5 - 34 U/L   ALT 32 0 - 55 U/L   Total Protein 7.7 6.4 - 8.3 g/dL   Albumin 4.2 3.5 - 5.0 g/dL   Calcium 9.9 8.4 - 10.4 mg/dL   Anion Gap 11 3 - 11 mEq/L   EGFR >90 >90 ml/min/1.73  m2    Comment: eGFR is calculated using the CKD-EPI Creatinine Equation (2009)  CA 27.29     Status: None   Collection Time: 08/20/16 12:29 PM  Result Value Ref Range   CA 27.29 33.7 0.0 - 38.6 U/mL    Comment: [employer]/ACS methodology  Lipid panel     Status: Abnormal   Collection Time: 08/26/16 10:37 AM  Result Value Ref Range   Cholesterol, Total 173 100 - 199 mg/dL   Triglycerides 167 (H) 0 - 149 mg/dL   HDL 50 >39 mg/dL   VLDL Cholesterol Cal 33 5 - 40 mg/dL   LDL Calculated 90 0 - 99 mg/dL   Chol/HDL Ratio 3.5 0.0 - 4.4 ratio units    Comment:                                   T. Chol/HDL Ratio                                             Men  Women                               1/2 Avg.Risk  3.4    3.3                                   Avg.Risk  5.0    4.4                                2X Avg.Risk  9.6    7.1                                3X Avg.Risk 23.4   11.0   Hemoglobin A1c     Status: Abnormal   Collection Time: 08/26/16 10:37 AM  Result Value  Ref Range   Hgb A1c MFr Bld 5.9 (H) 4.8 - 5.6 %    Comment:          Pre-diabetes: 5.7 - 6.4          Diabetes: >6.4          Glycemic control for adults with diabetes: <7.0    Est. average glucose Bld gHb Est-mCnc 123 mg/dL  TSH     Status: None   Collection Time: 08/26/16 10:37 AM  Result Value Ref Range   TSH 2.790 0.450 - 4.500 uIU/mL  POCT glucose (manual entry)     Status: None   Collection Time: 08/26/16 11:00 AM  Result Value Ref Range   POC Glucose 81 70 - 99 mg/dl    ASSESSMENT AND PLAN:  Angela [name] was seen today for blood work.  Diagnoses and all orders for this visit:  Screening for diabetes mellitus: Her labs look great. See problem 2 for discussion       -     POCT glucose (manual entry) -     Hemoglobin A1c  Physical deconditioning: She appears older than stated age, most likely secondary to a history of breast cancer and its associated treatments along with a lack of exercise.  I have  encouraged her son to encourage her to begin a light walking program.  A heart rate monitor would be most helpful, as the patient can then gauge the intensity to a moderate level as laid out on the AVS.    Screening, lipid: Lipids abnormal however overall very low risk for ASCVD at roughly 4.5%.  The risk of statin side effects are not worth the benefit.  -     Lipid panel  Screening for cardiovascular condition: NSR, normal axis, - hypertrophy, -ischemia, -infarction.  -     EKG 12-Lead    The patient is advised to call or return to clinic if she does not see an improvement in symptoms, or to seek the care of the closest emergency department if she worsens with the above plan.   Philis Fendt, MHS, PA-C [facility] 08/29/2016 7:46 PM

## 2016-08-26 NOTE — Patient Instructions (Addendum)
Try to find an exercise regimen that gets her heart rate up to 120-130.  She should strive to keep her heart rate between these two numbers by slowing down or going faster, regardless of the activity.     IF you received an x-ray today, you will receive an invoice from [facility]. Please contact [facility] at [phone] with questions or concerns regarding your invoice.   IF you received labwork today, you will receive an invoice from [place] [facility]. Please contact [facility] at [phone] with questions or concerns regarding your invoice.   Our billing staff will not be able to assist you with questions regarding bills from these companies.  You will be contacted with the lab results as soon as they are available. The fastest way to get your results is to activate your My Chart account. Instructions are located on the last page of this paperwork. If you have not heard from [place] regarding the results in 2 weeks, please contact this office.

## 2016-08-27 LAB — LIPID PANEL
Chol/HDL Ratio: 3.5 ratio (ref 0.0–4.4)
Cholesterol, Total: 173 mg/dL (ref 100–199)
HDL: 50 mg/dL (ref >39)
LDL Calculated: 90 mg/dL (ref 0–99)
Triglycerides: 167 mg/dL — ABNORMAL HIGH (ref 0–149)
VLDL Cholesterol Cal: 33 mg/dL (ref 5–40)

## 2016-08-27 LAB — HEMOGLOBIN A1C
Est. average glucose Bld gHb Est-mCnc: 123 mg/dL
Hgb A1c MFr Bld: 5.9 % — ABNORMAL HIGH (ref 4.8–5.6)

## 2016-08-27 LAB — TSH: TSH: 2.79 u[IU]/mL (ref 0.450–4.500)

## 2016-08-29 ENCOUNTER — Encounter: Payer: Self-pay | Admitting: Physician Assistant

## 2016-08-29 DIAGNOSIS — R7303 Prediabetes: Secondary | ICD-10-CM | POA: Insufficient documentation

## 2016-10-29 ENCOUNTER — Other Ambulatory Visit (HOSPITAL_COMMUNITY): Payer: Self-pay | Admitting: *Deleted

## 2016-10-29 DIAGNOSIS — N6320 Unspecified lump in the left breast, unspecified quadrant: Secondary | ICD-10-CM

## 2016-11-04 ENCOUNTER — Ambulatory Visit (HOSPITAL_COMMUNITY)
Admission: RE | Admit: 2016-11-04 | Discharge: 2016-11-04 | Disposition: A | Discharge status: Home / Self Care | Payer: Self-pay | Source: Ambulatory Visit | Attending: Obstetrics and Gynecology | Admitting: Obstetrics and Gynecology

## 2016-11-04 ENCOUNTER — Ambulatory Visit
Admission: RE | Admit: 2016-11-04 | Discharge: 2016-11-04 | Disposition: A | Discharge status: Home / Self Care | Payer: No Typology Code available for payment source | Source: Ambulatory Visit | Attending: Obstetrics and Gynecology | Admitting: Obstetrics and Gynecology

## 2016-11-04 ENCOUNTER — Encounter (HOSPITAL_COMMUNITY): Payer: Self-pay

## 2016-11-04 VITALS — BP 152/100 | Ht 62.0 in | Wt 186.0 lb

## 2016-11-04 DIAGNOSIS — N6320 Unspecified lump in the left breast, unspecified quadrant: Secondary | ICD-10-CM

## 2016-11-04 DIAGNOSIS — Z01419 Encounter for gynecological examination (general) (routine) without abnormal findings: Secondary | ICD-10-CM

## 2016-11-04 NOTE — Patient Instructions (Signed)
Explained breast self awareness with Angela [name]. Let patient know [facility] will cover Pap smears and HPV typing every 5 years unless has a history of abnormal Pap smears. Referred patient to the [facility] for left breast diagnostic mammogram per recommendation. Appointment scheduled for Thursday, Nov 04, 2016 at 0850. Let patient know will follow up with her within the next couple weeks with results of Pap smear by letter or phone. Angela [name] verbalized understanding.  Lissie Hinesley, [place], RN 8:41 AM

## 2016-11-04 NOTE — Progress Notes (Signed)
Patient referred to [facility] by the [facility] due to recommending 6 month left breast diagnostic mammogram. Last left breast diagnostic mammogram completed 02/27/2016.  Pap Smear: Pap smear completed today. Last Pap smear was over 10 years ago and normal per patient. Per patient has no history of an abnormal Pap smear. No Pap smear results are in EPIC.  Physical exam: Breasts Left breast larger than right breast due to history of right breast mastectomy for breast cancer in 2012. No skin abnormalities left breast. Red breast scar observed at mastectomy site. No nipple retraction left breast. No nipple discharge left breast. No lymphadenopathy. No lumps palpated bilateral breasts. No complaints of pain or tenderness on exam. Referred patient to the [facility] for left breast diagnostic mammogram per recommendation. Appointment scheduled for Thursday, Nov 04, 2016 at 0850.  Pelvic/Bimanual   Ext Genitalia No lesions, no swelling and no discharge observed on external genitalia.         Vagina Vagina pink and normal texture. No lesions or discharge observed in vagina.          Cervix Cervix is present. Cervix pink and of normal texture. No discharge observed.     Uterus Uterus is present and palpable. Uterus in normal position and normal size.        Adnexae Bilateral ovaries present and palpable. No tenderness on palpation.          Rectovaginal No rectal exam completed today since patient had no rectal complaints. No skin abnormalities observed on exam.    Smoking History: Patient has never smoked.  Patient Navigation: Patient education provided. Access to services provided for patient through [facility] program. Patient's son interpreted for her. Consent signed by son and patient due to no interpreters available that speak patient's language.  Colorectal Cancer Screening: Per patient had a colonoscopy completed 4-5 months ago. No complaints today.

## 2016-11-05 ENCOUNTER — Encounter (HOSPITAL_COMMUNITY): Payer: Self-pay | Admitting: *Deleted

## 2016-11-05 LAB — CYTOLOGY - PAP
DIAGNOSIS: NEGATIVE
HPV: NOT DETECTED

## 2016-11-09 ENCOUNTER — Encounter (HOSPITAL_COMMUNITY): Payer: Self-pay | Admitting: *Deleted

## 2016-11-09 NOTE — Progress Notes (Signed)
Letter mailed to patient concerning normal pap smear results. HPV was normal. Next pap smear due in five year.

## 2016-11-30 ENCOUNTER — Other Ambulatory Visit: Payer: Self-pay | Admitting: Hematology

## 2016-11-30 DIAGNOSIS — C50919 Malignant neoplasm of unspecified site of unspecified female breast: Secondary | ICD-10-CM

## 2016-11-30 MED FILL — TAMOXIFEN 20 MG TABLET: 20 | 90 days supply | Qty: 90 | Fill #0

## 2017-01-17 ENCOUNTER — Ambulatory Visit (HOSPITAL_COMMUNITY)
Admission: RE | Admit: 2017-01-17 | Discharge: 2017-01-17 | Disposition: A | Discharge status: Home / Self Care | Payer: Self-pay | Source: Ambulatory Visit | Attending: Urgent Care | Admitting: Urgent Care

## 2017-01-17 ENCOUNTER — Encounter: Payer: Self-pay | Admitting: Urgent Care

## 2017-01-17 ENCOUNTER — Ambulatory Visit (INDEPENDENT_AMBULATORY_CARE_PROVIDER_SITE_OTHER): Payer: Self-pay | Admitting: Urgent Care

## 2017-01-17 DIAGNOSIS — Z853 Personal history of malignant neoplasm of breast: Secondary | ICD-10-CM

## 2017-01-17 DIAGNOSIS — Z79810 Long term (current) use of selective estrogen receptor modulators (SERMs): Secondary | ICD-10-CM

## 2017-01-17 DIAGNOSIS — M79661 Pain in right lower leg: Secondary | ICD-10-CM

## 2017-01-17 DIAGNOSIS — M79651 Pain in right thigh: Secondary | ICD-10-CM

## 2017-01-17 LAB — POCT CBC
Granulocyte percent: 62.2 %G (ref 37–80)
HEMATOCRIT: 35.8 % — AB (ref 37.7–47.9)
Hemoglobin: 12.4 g/dL (ref 12.2–16.2)
Lymph, poc: 1.8 (ref 0.6–3.4)
MCH: 29.7 pg (ref 27–31.2)
MCHC: 34.8 g/dL (ref 31.8–35.4)
MCV: 85.6 fL (ref 80–97)
MID (CBC): 0.4 (ref 0–0.9)
MPV: 7.6 fL (ref 0–99.8)
POC Granulocyte: 3.7 (ref 2–6.9)
POC LYMPH PERCENT: 30.6 %L (ref 10–50)
POC MID %: 7.2 % (ref 0–12)
Platelet Count, POC: 250 10*3/uL (ref 142–424)
RBC: 4.18 M/uL (ref 4.04–5.48)
RDW, POC: 12.5 %
WBC: 6 10*3/uL (ref 4.6–10.2)

## 2017-01-17 NOTE — Patient Instructions (Addendum)
We will have an ultrasound done of her right leg to rule out a blood clot. We will call you with the results. However, if she develops chest pain, shortness of breath, fever - these are worrisome signs. She needs to return to our clinic immediately or report to the ER. If this ultrasound result is negative we will manage this as a muscular type pain associated with her walking. You may use hot and cold packs if this is the case. She should also rest for a couple of days from her walking to allow for healing.     IF you received an x-ray today, you will receive an invoice from [facility]. Please contact [facility] at [phone] with questions or concerns regarding your invoice.   IF you received labwork today, you will receive an invoice from [place]. Please contact [facility] at [phone] with questions or concerns regarding your invoice.   Our billing staff will not be able to assist you with questions regarding bills from these companies.  You will be contacted with the lab results as soon as they are available. The fastest way to get your results is to activate your My Chart account. Instructions are located on the last page of this paperwork. If you have not heard from [place] regarding the results in 2 weeks, please contact this office.

## 2017-01-17 NOTE — Progress Notes (Signed)
MRN: [id number] DOB: [birth year]  Subjective:   Angela [name] is a 51 y.o. female presenting for chief complaint of Leg Pain (R leg x 1 month. Originally started in calf and has moved to thigh area. Will work and after a few minutes would complain of pain. )  Reports 1 month history of worsening right lower leg pain, has now progressed to her right lateral thigh. Denies fever, cough, chest pain, trauma, falls, calf redness, warmth, swelling. She admits that she has had shob over the past year, but this has improved over the past month when she started walking daily for exercise. She is taking tamoxifen for her history of breast cancer, she is currently in remission. Denies smoking cigarettes.  Angela [name] has a current medication list which includes the following prescription(s): biotin, fish oil-omega-3 fatty acids, multiple vitamins-minerals, NON FORMULARY, tamoxifen, and tamoxifen. Also has No Known Allergies.  Angela [name]  has a past medical history of Breast cancer ([place]) (06/18/2010) and Stomach ulcer (07/04/2012). Also  has a past surgical history that includes Mastectomy with axillary lymph node dissection (Right, 06/18/2010) and Breast surgery.  Objective:   Vitals: BP (!) 148/91   Pulse 92   Temp 98.2 F (36.8 C) (Oral)   Resp 18   Ht 5\' 1"  (1.549 m)   Wt 189 lb 3.2 oz (85.8 kg)   SpO2 97%   BMI 35.75 kg/m   BP Readings from Last 3 Encounters:  01/17/17 (!) 148/91  11/04/16 (!) 152/100  08/26/16 120/80   Physical Exam  Constitutional: She is oriented to person, place, and time. She appears well-developed and well-nourished.  HENT:  Mouth/Throat: Oropharynx is clear and moist.  Eyes: No scleral icterus.  Cardiovascular: Normal rate, regular rhythm and intact distal pulses.  Exam reveals no gallop and no friction rub.   No murmur heard. Pulmonary/Chest: No respiratory distress. She has no wheezes. She has no rales.  Musculoskeletal:       Right upper leg: She exhibits no tenderness, no  bony tenderness, no swelling, no edema, no deformity and no laceration.       Right lower leg: She exhibits no tenderness (Negative Homann sign), no bony tenderness, no swelling, no edema, no deformity and no laceration.       Left lower leg: She exhibits no tenderness, no bony tenderness, no swelling, no edema, no deformity and no laceration.       Legs: Neurological: She is alert and oriented to person, place, and time.  Skin: Skin is warm and dry.  Psychiatric: She has a normal mood and affect.   Results for orders placed or performed in visit on 01/17/17 (from the past 24 hour(s))  POCT CBC     Status: Abnormal   Collection Time: 01/17/17 12:20 PM  Result Value Ref Range   WBC 6.0 4.6 - 10.2 K/uL   Lymph, poc 1.8 0.6 - 3.4   POC LYMPH PERCENT 30.6 10 - 50 %L   MID (cbc) 0.4 0 - 0.9   POC MID % 7.2 0 - 12 %M   POC Granulocyte 3.7 2 - 6.9   Granulocyte percent 62.2 37 - 80 %G   RBC 4.18 4.04 - 5.48 M/uL   Hemoglobin 12.4 12.2 - 16.2 g/dL   HCT, POC 35.8 (A) 37.7 - 47.9 %   MCV 85.6 80 - 97 fL   MCH, POC 29.7 27 - 31.2 pg   MCHC 34.8 31.8 - 35.4 g/dL   RDW, POC 12.5 %   Platelet  Count, POC 250 142 - 424 K/uL   MPV 7.6 0 - 99.8 fL   Assessment and Plan :   1. Pain in right lower leg 2. Pain in right thigh 3. History of breast cancer in female 4. Long-term current use of tamoxifen - Will obtain LE U/S to r/o dvt. Her physical exam findings are very reassuring, I suspect more of a muscular issue related to her walking. I also feel reassured against chest PE given no chest pain, shob, fever or tachycardia. Return-to-clinic precautions discussed, patient verbalized understanding. If her U/S is negative, then we will manage her symptoms for muscular type pain. Patient and her family is in agreement.  Jaynee Eagles, PA-C [facility] [phone] 01/17/2017  11:39 AM

## 2017-01-17 NOTE — Progress Notes (Signed)
VASCULAR LAB PRELIMINARY  PRELIMINARY  PRELIMINARY  PRELIMINARY  Right lower extremity venous duplex completed.    Preliminary report:  There is no DVT or SVT noted in the right lower extremity.  Incidentally, arterial flow also appears adequate.   Adriene Knipfer, RVT 01/17/2017, 4:46 PM

## 2017-01-18 LAB — COMPREHENSIVE METABOLIC PANEL
A/G RATIO: 1.7 (ref 1.2–2.2)
ALBUMIN: 4.1 g/dL (ref 3.5–5.5)
ALK PHOS: 88 IU/L (ref 39–117)
ALT: 21 IU/L (ref 0–32)
AST: 26 IU/L (ref 0–40)
BILIRUBIN TOTAL: 0.3 mg/dL (ref 0.0–1.2)
BUN/Creatinine Ratio: 13 (ref 9–23)
BUN: 10 mg/dL (ref 6–24)
CO2: 23 mmol/L (ref 20–29)
CREATININE: 0.76 mg/dL (ref 0.57–1.00)
Calcium: 9.2 mg/dL (ref 8.7–10.2)
Chloride: 103 mmol/L (ref 96–106)
GFR calc Af Amer: 104 mL/min/{1.73_m2} (ref >59)
GFR calc non Af Amer: 90 mL/min/{1.73_m2} (ref >59)
GLOBULIN, TOTAL: 2.4 g/dL (ref 1.5–4.5)
Glucose: 195 mg/dL — ABNORMAL HIGH (ref 65–99)
POTASSIUM: 4.1 mmol/L (ref 3.5–5.2)
SODIUM: 142 mmol/L (ref 134–144)
Total Protein: 6.5 g/dL (ref 6.0–8.5)

## 2017-01-19 ENCOUNTER — Other Ambulatory Visit: Payer: Self-pay | Admitting: *Deleted

## 2017-01-19 NOTE — Addendum Note (Signed)
Addended by: Burnis Kingfisher on: 01/19/2017 11:06 AM   Modules accepted: Orders

## 2017-01-20 ENCOUNTER — Encounter: Payer: Self-pay | Admitting: Urgent Care

## 2017-01-20 LAB — HEMOGLOBIN A1C
ESTIMATED AVERAGE GLUCOSE: 134 mg/dL
Hgb A1c MFr Bld: 6.3 % — ABNORMAL HIGH (ref 4.8–5.6)

## 2017-02-14 NOTE — Progress Notes (Addendum)
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   STAGE:  Stage III ( Invasive Ductal Carcinoma, ER+ Her2Neu+)  Right breast s/p mastectomy with ALND, 5.3 cm   PRIOR THERAPY: #1originally from [place]. Patient had her annual mammogram performed in November 2012 and she felt a mass. In 05/08/2011 patient underwent a right modified radical mastectomy with level II axillary clearance. Her final pathology revealed a invasive ductal carcinoma that was grade 3 measuring 5.3 cm in greatest dimension. All margins were negative. She was noted to have level lymphovascular invasion. Patient had 22 lymph nodes removed one was positive for metastatic disease without extracapsular extension.. Patient went on to receive adjuvant chemotherapy consisting of 6 cycles of Taxotere Adriamycin and Cytoxan. The tumor was ER positive PR positive HER-2/neu positive.She was then placed on tamoxifen 20 mg on a daily basis. She tolerated this quite well  CURRENT THERAPY: tamoxifen 20 mg daily since 2013  INTERVAL HISTORY: Angela [name] 51 y.o. female returns for followup visit today. She doesn't speak [place], but her son translates for her. She was last seen in our office on 08/20/2016. She has been doing well since then, has been following up with other providers. One to 2 months ago she noticed sudden right leg pain without known injury. No ankle, knee, back or hip joint pain. She notes a sharp pain in her right calf that progresses up towards her thigh within 10 minutes of walking. It improves with rest and ibuprofen. However, it does occasionally occur while at rest. She denies cramping, weakness, swelling, redness, warmth, or joint aches. Occasionally affects her sleep. LE venous doppler ruled out DVT on 01/17/2017.   Over the  last 6 months she has changed her diet increase exercise and had intentional weight loss. She has established care with a PCP who performed a physical with lipid panel. Her recent Pap was normal. Mammogram on 11/04/2016 showed oval, circumscribed nodules within the posterior, superior left breast that are unchanged from 02/2016 exam; no new or suspicious abnormalities.   MEDICAL HISTORY: Past Medical History:  Diagnosis Date  . Breast cancer ([place]) 06/18/2010   stage II  . Stomach ulcer 07/04/2012   ALLERGIES:  has No Known Allergies.  MEDICATIONS:  Current Outpatient Prescriptions  Medication Sig Dispense Refill  . BIOTIN 5000 PO Take by mouth.    . fish oil-omega-3 fatty acids 1000 MG capsule Take 2 g by mouth daily.    . Multiple Vitamins-Minerals (MULTIVITAMIN PO) Take 1 tablet by mouth daily.    . NON FORMULARY Risek 65m as needed for pain    . tamoxifen (NOLVADEX) 20 MG tablet Take 1 tablet (20 mg total) by mouth daily. 90 tablet 2   No current facility-administered medications for this visit.    SURGICAL HISTORY:  Past Surgical History:  Procedure Laterality Date  . BREAST SURGERY    . MASTECTOMY WITH AXILLARY LYMPH NODE DISSECTION Right 06/18/2010   T3N1   REVIEW OF SYSTEMS: Review of Systems  Constitutional: Negative.  Negative for chills, fever and malaise/fatigue.       Denies hot flashes  HENT: Negative.   Eyes: Negative.   Respiratory: Negative.  Negative for shortness of breath (with exertion).   Cardiovascular: Negative.  Negative for chest pain, palpitations and leg swelling.  Gastrointestinal: Negative.  Negative for  abdominal pain, blood in stool, constipation, diarrhea, nausea and vomiting.  Genitourinary: Negative.        Denies vaginal bleeding  Musculoskeletal: Positive for myalgias.       Right calf to thigh pain; occurs with ambulation and at rest  Skin: Negative.   Neurological: Negative.  Negative for sensory change and focal weakness.   Endo/Heme/Allergies: Negative.  Does not bruise/bleed easily.  Psychiatric/Behavioral: Negative.  Negative for depression.       Denies fluctuating mood  All other systems reviewed and are negative.  PHYSICAL EXAMINATION: Blood pressure (!) 151/89, pulse 80, temperature 98 F (36.7 C), temperature source Oral, resp. rate 18, height '5\' 1"'  (1.549 m), weight 184 lb (83.5 kg), SpO2 100 %. Body mass index is 34.77 kg/m. BP (!) 151/89 (BP Location: Left Arm, Patient Position: Sitting) Comment: nurse aware  Pulse 80   Temp 98 F (36.7 C) (Oral)   Resp 18   Ht '5\' 1"'  (1.549 m)   Wt 184 lb (83.5 kg)   SpO2 100%   BMI 34.77 kg/m  ECOG PERFORMANCE STATUS: 0 - Asymptomatic  PHYSICAL EXAMINATION: General appearance: alert, cooperative, appears stated age and no distress Head: Normocephalic, without obvious abnormality, atraumatic Neck:  Supple, trachea midline, thyroid not enlarged, symmetric.  Lymph nodes: no palpable cervical, supraclavicular, or axillary nodes. Resp: clear to auscultation bilaterally, regular rate and rhythm. Back: symmetric, no curvature. ROM normal. No CVA tenderness. negative straight leg raise. Cardio: regular rate and rhythm, S1, S2 normal, no murmur, click, rub or gallop GI: soft, non-tender; bowel sounds normal; no masses,  no organomegaly Extremities: extremities normal, atraumatic, no cyanosis or edema. No pain on palpation of right extremity. Pedal pulses 2+ bilaterally, extremities warm and well-perfused.  Neurologic: alert and oriented x3, normal strength and tone. No focal or sensory deficits.  Breasts: Right mastectomy, incision is well-healed without evidence of skin changes or local recurrence. Left breast without any palpable masses, skin changes of concern nipple changes or nipple discharge. No palpable axillary lymph nodes or masses.  LABORATORY DATA:  CBC Latest Ref Rng & Units 02/17/2017 01/17/2017 08/20/2016  WBC 3.9 - 10.3 10e3/uL 5.0 6.0 6.4   Hemoglobin 11.6 - 15.9 g/dL 13.4 12.4 13.4  Hematocrit 34.8 - 46.6 % 38.4 35.8(A) 38.6  Platelets 145 - 400 10e3/uL 205 - 217   CMP Latest Ref Rng & Units 02/17/2017 01/17/2017 08/20/2016  Glucose 70 - 140 mg/dl 108 195(H) 113  BUN 7.0 - 26.0 mg/dL 13.3 10 10.3  Creatinine 0.6 - 1.1 mg/dL 0.8 0.76 0.8  Sodium 136 - 145 mEq/L 140 142 139  Potassium 3.5 - 5.1 mEq/L 4.0 4.1 4.2  Chloride 96 - 106 mmol/L - 103 -  CO2 22 - 29 mEq/L '25 23 24  ' Calcium 8.4 - 10.4 mg/dL 9.6 9.2 9.9  Total Protein 6.4 - 8.3 g/dL 7.4 6.5 7.7  Total Bilirubin 0.20 - 1.20 mg/dL 0.50 0.3 0.55  Alkaline Phos 40 - 150 U/L 83 88 96  AST 5 - 34 U/L 35(H) 26 31  ALT 0 - 55 U/L 32 21 32   RADIOGRAPHIC STUDIES:  Diagnostic mammogram unilateral L breast 11/04/16 IMPRESSION:The previously described oval, circumscribed nodules within the posterior, superior left breast are unchanged from the September 2017 exam and are again thought to represent normal intramammary lymph nodes. No new or suspicious abnormality is Identified. Probably benign left breast findings. RECOMMENDATION: Left diagnostic mammogram in 6 months.  Digital Screening Mammogram L breast 02/19/2016 IMPRESSION: Further evaluation  is suggested for possible asymmetry in the left Breast.  Diagnostic Mammogram L brest 02/27/2016 IMPRESSION: 1. There are 2 adjacent masses in the upper-outer left breast, both of which likely represent benign lymph nodes, though only 1 was definitely identified sonographically.  2.  No mammographic evidence of left breast malignancy.  Bone density scan 01/10/2015 ASSESSMENT: Patient's diagnostic category is NORMAL by WHO Criteria.  ASSESSMENT: 51 y.o. 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   1. stage III invasive ductal carcinoma ER positive HER-2/neu positive, diagnosed in 05/2011 -status post right mastectomy with axillary lymph  node dissection for a 5.3 cm node-positive disease. Patient subsequently underwent adjuvant chemotherapy consisting of 6 cycles of Taxotere Adriamycin and Cytoxan. She did not receive anti-HER-2 therapy due to availability and financial issue. -She has been on adjuvant Tamoxifen 20 mg daily since mid year 2013. She has been tolerating it well without noticeable side effects. I discussed the option of continue tamoxifen for total of 10 years, versus change to aromatase inhibitor for additional 5 years if she is truly postmenopausal. [place] and Estrogen level in 2016 indicated she is not yet postmenopausal, we'll continue tamoxifen -Patient is clinically doing well, lab reviewed, CBC and CMP are WNL, physical exam today is unremarkable, no clinical concern for recurrent disease -Her last mammogram and ultrasound in 11/04/2016 showed The previously described oval, circumscribed nodules within the posterior, superior left breast are unchanged from the September 2017 exam and are again thought to represent normal intramammary lymph nodes. No new or suspicious abnormality is identified.September 2017 -6 month follow-up diagnostic mammogram was recommended, I ordered that today to be done 04/2017. -I encouraged her to continue healthy diet and regular exercise and to do self breast exam. -Lab and f/u in 2 months  2. Bone health -Recent bone density scan was normal, last in 2016 -Dr. Burr Medico previously encouraged her to take calcium and Vitamin D; she is on daily multivitamin  4. Lipid and BP issue -She is slightly hypertensive today. No past medical history of hypertension. Her son suggests white coat phenomenon -Her son reports it is never over 140 when he checks it at home.  -She has established care with PCP, lipid panel 08/26/16 with slightly elevate TG (167) otherwise WNL.  5. Right leg pain -Neurovascular exam WNL, Negative straight leg raise; low suspicion for bone metastasis -Xray R hip obtained today  is negative for fracture, dislocation, arthropathy, or focal bone abnormality -will monitor closely with f/u in 2 months, if pain persists, will order bone scan to rule out bone metastasis  -Encouraged to follow up with PCP to explore other causes of pain  Plan -Continue tamoxifen. Refilled today -Ordered mammogram due in 2 months -labs and f/u in 2 months after mammogram -Right hip xray today; bone scan in 2 months if pain persists -Continue f/u with PCP  All questions were answered. The patient knows to call the clinic with any problems, questions or concerns. We can certainly see the patient much sooner if necessary.  I spent 20 minutes counseling the patient face to face. The total time spent in the appointment was 30 minutes.  Cira Rue, AGNP-C 02/17/2017  This document serves as a record of services personally performed by [name], MD. It was created on her behalf by Arlyce Harman, a trained medical scribe. The creation of this record is based on the scribe's personal observations and the provider's statements to them. This  document has been checked and approved by the attending provider.  I have seen the patient, examined her. I agree with the assessment and and plan and have edited the notes.    Burr Medico, Yan,MD 02/17/2017

## 2017-02-17 ENCOUNTER — Ambulatory Visit (HOSPITAL_COMMUNITY)
Admission: RE | Admit: 2017-02-17 | Discharge: 2017-02-17 | Disposition: A | Discharge status: Home / Self Care | Payer: Self-pay | Source: Ambulatory Visit | Attending: Nurse Practitioner | Admitting: Nurse Practitioner

## 2017-02-17 ENCOUNTER — Ambulatory Visit (HOSPITAL_BASED_OUTPATIENT_CLINIC_OR_DEPARTMENT_OTHER): Payer: Self-pay | Admitting: Hematology

## 2017-02-17 ENCOUNTER — Other Ambulatory Visit (HOSPITAL_BASED_OUTPATIENT_CLINIC_OR_DEPARTMENT_OTHER): Payer: Self-pay

## 2017-02-17 ENCOUNTER — Telehealth: Payer: Self-pay

## 2017-02-17 ENCOUNTER — Encounter: Payer: Self-pay | Admitting: Hematology

## 2017-02-17 VITALS — BP 151/89 | HR 80 | Temp 98.0°F | Resp 18 | Ht 61.0 in | Wt 184.0 lb

## 2017-02-17 DIAGNOSIS — Z170 Estrogen receptor positive status [ER+]: Secondary | ICD-10-CM

## 2017-02-17 DIAGNOSIS — C50911 Malignant neoplasm of unspecified site of right female breast: Secondary | ICD-10-CM

## 2017-02-17 DIAGNOSIS — M79604 Pain in right leg: Secondary | ICD-10-CM

## 2017-02-17 DIAGNOSIS — Z79811 Long term (current) use of aromatase inhibitors: Secondary | ICD-10-CM

## 2017-02-17 LAB — CBC WITH DIFFERENTIAL/PLATELET
BASO%: 0.8 % (ref 0.0–2.0)
Basophils Absolute: 0 10*3/uL (ref 0.0–0.1)
EOS%: 2.9 % (ref 0.0–7.0)
Eosinophils Absolute: 0.1 10*3/uL (ref 0.0–0.5)
HEMATOCRIT: 38.4 % (ref 34.8–46.6)
HEMOGLOBIN: 13.4 g/dL (ref 11.6–15.9)
LYMPH#: 1.7 10*3/uL (ref 0.9–3.3)
LYMPH%: 33.1 % (ref 14.0–49.7)
MCH: 30.4 pg (ref 25.1–34.0)
MCHC: 34.7 g/dL (ref 31.5–36.0)
MCV: 87.6 fL (ref 79.5–101.0)
MONO#: 0.5 10*3/uL (ref 0.1–0.9)
MONO%: 10.8 % (ref 0.0–14.0)
NEUT#: 2.6 10*3/uL (ref 1.5–6.5)
NEUT%: 52.4 % (ref 38.4–76.8)
Platelets: 205 10*3/uL (ref 145–400)
RBC: 4.39 10*6/uL (ref 3.70–5.45)
RDW: 12.6 % (ref 11.2–14.5)
WBC: 5 10*3/uL (ref 3.9–10.3)

## 2017-02-17 LAB — COMPREHENSIVE METABOLIC PANEL
ALBUMIN: 4 g/dL (ref 3.5–5.0)
ALT: 32 U/L (ref 0–55)
AST: 35 U/L — AB (ref 5–34)
Alkaline Phosphatase: 83 U/L (ref 40–150)
Anion Gap: 10 mEq/L (ref 3–11)
BUN: 13.3 mg/dL (ref 7.0–26.0)
CALCIUM: 9.6 mg/dL (ref 8.4–10.4)
CHLORIDE: 104 meq/L (ref 98–109)
CO2: 25 mEq/L (ref 22–29)
Creatinine: 0.8 mg/dL (ref 0.6–1.1)
EGFR: 88 mL/min/{1.73_m2} — ABNORMAL LOW (ref >90)
Glucose: 108 mg/dl (ref 70–140)
Potassium: 4 mEq/L (ref 3.5–5.1)
Sodium: 140 mEq/L (ref 136–145)
Total Bilirubin: 0.5 mg/dL (ref 0.20–1.20)
Total Protein: 7.4 g/dL (ref 6.4–8.3)

## 2017-02-17 MED ORDER — TAMOXIFEN CITRATE 20 MG PO TABS: 20.0000 mg | ORAL_TABLET | Freq: Every day | ORAL | 2 refills | Status: AC

## 2017-02-17 MED FILL — TAMOXIFEN CITRATE 20 MG TAB: 20 | 90 days supply | Qty: 90 | Fill #0

## 2017-02-17 NOTE — Telephone Encounter (Signed)
No orders per 9/13 los

## 2017-02-18 LAB — CANCER ANTIGEN 27.29: CA 27.29: 30.7 U/mL (ref 0.0–38.6)

## 2017-02-25 ENCOUNTER — Telehealth: Payer: Self-pay

## 2017-02-25 ENCOUNTER — Telehealth: Payer: Self-pay | Admitting: Nurse Practitioner

## 2017-02-25 ENCOUNTER — Encounter: Payer: Self-pay | Admitting: Nurse Practitioner

## 2017-02-25 ENCOUNTER — Telehealth: Payer: Self-pay | Admitting: *Deleted

## 2017-02-25 NOTE — Telephone Encounter (Signed)
Son called for tumor marker and hip xray results. Results given.

## 2017-02-25 NOTE — Telephone Encounter (Signed)
The patient's son left message requesting a call back for hip xray and lab results. I called back to inform him both are normal. Angela [name] leg is still hurting, she has a PCP appointment on Monday. I asked him to keep [place] informed and let us know if they need further assistance. He agrees and thanks for the call.

## 2017-02-25 NOTE — Telephone Encounter (Signed)
Lacie, could you call him back? Lab and x-ray looks fine. Thanks   [name] MD

## 2017-02-25 NOTE — Telephone Encounter (Signed)
Pt's sone called to request results of R Hip X-Ray and blood test results.  Callback # [phone]

## 2017-02-28 ENCOUNTER — Ambulatory Visit (INDEPENDENT_AMBULATORY_CARE_PROVIDER_SITE_OTHER): Payer: Self-pay

## 2017-02-28 ENCOUNTER — Encounter: Payer: Self-pay | Admitting: Physician Assistant

## 2017-02-28 ENCOUNTER — Ambulatory Visit (INDEPENDENT_AMBULATORY_CARE_PROVIDER_SITE_OTHER): Payer: Self-pay | Admitting: Physician Assistant

## 2017-02-28 VITALS — BP 116/70 | HR 85 | Resp 16 | Ht 61.0 in | Wt 181.8 lb

## 2017-02-28 DIAGNOSIS — M79604 Pain in right leg: Secondary | ICD-10-CM

## 2017-02-28 DIAGNOSIS — M5431 Sciatica, right side: Secondary | ICD-10-CM

## 2017-02-28 LAB — POCT GLYCOSYLATED HEMOGLOBIN (HGB A1C): HEMOGLOBIN A1C: 6.2

## 2017-02-28 MED ORDER — PREDNISONE 50 MG PO TABS: ORAL_TABLET | ORAL | 0 refills | Status: DC

## 2017-02-28 MED ORDER — MELOXICAM 7.5 MG PO TABS: 7.5000 mg | ORAL_TABLET | Freq: Every day | ORAL | 0 refills | Status: AC

## 2017-02-28 MED FILL — MELOXICAM 7.5 MG TABLET: 7.5 | 7 days supply | Qty: 15 | Fill #0

## 2017-02-28 MED FILL — predniSONE 50 MG TABS: 50 | 5 days supply | Qty: 5 | Fill #0

## 2017-02-28 NOTE — Patient Instructions (Addendum)
FINDINGS: Normal alignment. Disc space narrowing at L5-S1. Mild degenerative facet disease in the mid and lower lumbar spine. No subluxation. SI joints are symmetric and unremarkable.  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 Mild degenerative facet disease in the mid and lower lumbar spine. No acute findings.   Take prednisone course first, then start the 1/2 tab meloxicam as needed after finishing predinosone.   If the pain persist in 6 weeks please call so I can refer to orthopedics.    IF you received an x-ray today, you will receive an invoice from [facility]. Please contact [facility] at [phone] with questions or concerns regarding your invoice.   IF you received labwork today, you will receive an invoice from [facility]. Please contact [facility] at [phone] with questions or concerns regarding your invoice.   Our billing staff will not be able to assist you with questions regarding bills from these companies.  You will be contacted with the lab results as soon as they are available. The fastest way to get your results is to activate your My Chart account. Instructions are located on the last page of this paperwork. If you have not heard from [place] regarding the results in 2 weeks, please contact this office.

## 2017-02-28 NOTE — Progress Notes (Signed)
02/28/2017 11:38 AM   DOB: May 07, 1966 / MRN: [id number]  SUBJECTIVE:  Angela [name] is a 51 y.o. female presenting for right posterior leg pain that has been present and worse with physical activity for approximately one month.  Pain does occur at night and at rest.  DVT has been ruled out.  Saw hemonc who reassured her that this was not a manifestation of cancer.    There is no immunization history on file for this patient.   She has No Known Allergies.   She  has a past medical history of Breast cancer ([place]) (06/18/2010) and Stomach ulcer (07/04/2012).    She  reports that she has never smoked. She has never used smokeless tobacco. She reports that she does not drink alcohol or use drugs. She  reports that she does not currently engage in sexual activity. The patient  has a past surgical history that includes Mastectomy with axillary lymph node dissection (Right, 06/18/2010) and Breast surgery.  Her family history includes Breast cancer (age of onset: 60) in her cousin.  Review of Systems  Constitutional: Negative for chills, diaphoresis and fever.  Respiratory: Negative for cough, hemoptysis, sputum production, shortness of breath and wheezing.   Cardiovascular: Negative for chest pain, orthopnea and leg swelling.  Gastrointestinal: Negative for nausea.  Skin: Negative for rash.  Neurological: Negative for dizziness.    The problem list and medications were reviewed and updated by myself where necessary and exist elsewhere in the encounter.   OBJECTIVE:  BP 116/70 (BP Location: Left Arm, Patient Position: Sitting, Cuff Size: Large)   Pulse 85   Resp 16   Ht 5\' 1"  (1.549 m)   Wt 181 lb 12.8 oz (82.5 kg)   SpO2 100%   BMI 34.35 kg/m    Wt Readings from Last 3 Encounters:  02/28/17 181 lb 12.8 oz (82.5 kg)  02/17/17 184 lb (83.5 kg)  01/17/17 189 lb 3.2 oz (85.8 kg)   Temp Readings from Last 3 Encounters:  02/17/17 98 F (36.7 C) (Oral)  01/17/17 98.2 F (36.8 C)  (Oral)  08/26/16 98.5 F (36.9 C) (Oral)   BP Readings from Last 3 Encounters:  02/28/17 116/70  02/17/17 (!) 151/89  01/17/17 (!) 148/91   Pulse Readings from Last 3 Encounters:  02/28/17 85  02/17/17 80  01/17/17 92     Lab Results  Component Value Date   HGBA1C 6.3 (H) 01/19/2017     Physical Exam  Constitutional: She is active.  Cardiovascular: Normal rate, regular rhythm, S1 normal, S2 normal, normal heart sounds and intact distal pulses.  Exam reveals no gallop, no friction rub and no decreased pulses.   No murmur heard. Pulmonary/Chest: Effort normal. No stridor. No tachypnea. No respiratory distress. She has no wheezes. She has no rales.  Abdominal: She exhibits no distension.  Musculoskeletal: She exhibits tenderness (about the right sciatic notch and along the posterior thigh.  Pain increased with hip flexion, knee extensiont and internal rotation at the hip. ). She exhibits no edema.  Neurological: She is alert.  Skin: Skin is warm.    No results found for this or any previous visit (from the past 72 hour(s)).  Dg Lumbar Spine 2-3 Views  Result Date: 02/28/2017 CLINICAL DATA:  Posterior right leg pain. EXAM: LUMBAR SPINE - 2-3 VIEW COMPARISON:  None. FINDINGS: Normal alignment. Disc space narrowing at L5-S1. Mild degenerative facet disease in the mid and lower lumbar spine. No subluxation. SI joints are symmetric and  unremarkable. 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 Mild degenerative facet disease in the mid and lower lumbar spine. No acute findings. Electronically Signed   By: Rolm Baptise M.D.   On: 02/28/2017 11:25    ASSESSMENT AND PLAN:  Angela [name] was seen today for leg pain.  Diagnoses and all orders for this visit:  Pain of right lower extremity -     DG Lumbar Spine 2-3 Views; Future -     POCT glycosylated hemoglobin (Hb A1C)    The patient is advised to call or return to clinic if she does not see an improvement in symptoms, or  to seek the care of the closest emergency department if she worsens with the above plan.   Philis Fendt, MHS, PA-C [facility] 02/28/2017 11:38 AM

## 2017-03-25 ENCOUNTER — Telehealth: Payer: Self-pay

## 2017-03-25 NOTE — Telephone Encounter (Signed)
Pt son called concerned because pt not scheduled for 2 month f/u per Cira Rue, NP note on 9/13. Pt son works in [employer], research (medical) and needs to ask off soon for appt dates. Scheduling message sent to have pt scheduled for lab and f/u with MD the week of 05/16/17. Pt to call back next week if he has not heard anything yet. Confirmed plan with Cira Rue, NP. Dr. Burr Medico out of the office today.

## 2017-03-28 ENCOUNTER — Telehealth: Payer: Self-pay | Admitting: Hematology

## 2017-03-28 ENCOUNTER — Telehealth: Payer: Self-pay | Admitting: *Deleted

## 2017-03-28 NOTE — Telephone Encounter (Signed)
Called patient regarding 11/12 I will mail a letter

## 2017-03-28 NOTE — Telephone Encounter (Signed)
"  This is Angela [name] calling on behalf of my mother.  When she was seen she was scheduled and I was to receive appointment information in the mail.  Not received so I called several times last week.  I need appointment for my mom.  Several times I call unable to reach anyone and when you do no one can help you.  This is a doctor's office not ticket sales.  I should not have to but do I have to come there in person?  My mother needs to be scheduled for bone scan and F/U with Dr. Burr Medico.  I'm not worried about the mammogram, I've scheduled the mammogram for 05-09-2017.  She had to wait six months out for the mammogram.  She is still having pain so I am not sure why bone scan was not scheduled with the mammogram.  I ned Dr. Ernestina Penna nurse to call me to explain what is going on."  Shared with Angela this nurse will transfer to a [facility] scheduler.  Will notify Dr. Burr Medico who returns tomorrow of need for bone scan for orders for [facility] to schedule.  No one on line to return to resume call.  Message left for son.  Printed call documentation for scheduler.

## 2017-03-29 ENCOUNTER — Other Ambulatory Visit: Payer: Self-pay | Admitting: Hematology

## 2017-03-29 DIAGNOSIS — Z170 Estrogen receptor positive status [ER+]: Principal | ICD-10-CM

## 2017-03-29 DIAGNOSIS — C50911 Malignant neoplasm of unspecified site of right female breast: Secondary | ICD-10-CM

## 2017-03-29 NOTE — Telephone Encounter (Signed)
I have ordered her bone scan to be done the week before her next OV. I called her son and informed that and apologized the difficulty he had to reach [place] by phone, I encouraged him to use my chart in future.   [name] MD

## 2017-04-04 ENCOUNTER — Telehealth: Payer: Self-pay | Admitting: *Deleted

## 2017-04-04 NOTE — Telephone Encounter (Signed)
"  I calling about appointments for my mother.  Dr. Burr Medico called me herself and we have not received a return call from scheduling.  We've complained about this for the past two months and still no return call."  Bone scan ordered 03-29-2017.  Provided Radiology central scheduling number for use as needed.  Denies further questions or needs from [facility] at this time.  Call transferred to [facility] with Radiology Scheduling.

## 2017-04-12 ENCOUNTER — Ambulatory Visit (HOSPITAL_COMMUNITY)
Admission: RE | Admit: 2017-04-12 | Discharge: 2017-04-12 | Disposition: A | Discharge status: Home / Self Care | Payer: Self-pay | Source: Ambulatory Visit | Attending: Hematology | Admitting: Hematology

## 2017-04-12 ENCOUNTER — Encounter (HOSPITAL_COMMUNITY)
Admission: RE | Admit: 2017-04-12 | Discharge: 2017-04-12 | Disposition: A | Discharge status: Home / Self Care | Payer: Self-pay | Source: Ambulatory Visit | Attending: Hematology | Admitting: Hematology

## 2017-04-12 DIAGNOSIS — Z170 Estrogen receptor positive status [ER+]: Secondary | ICD-10-CM | POA: Insufficient documentation

## 2017-04-12 DIAGNOSIS — C50911 Malignant neoplasm of unspecified site of right female breast: Secondary | ICD-10-CM | POA: Insufficient documentation

## 2017-04-12 MED ORDER — TECHNETIUM TC 99M MEDRONATE IV KIT
25.0000 | PACK | Freq: Once | INTRAVENOUS | Status: AC | PRN
  Administered 2017-04-12: 25 via INTRAVENOUS

## 2017-04-14 ENCOUNTER — Telehealth: Payer: Self-pay | Admitting: Emergency Medicine

## 2017-04-14 NOTE — Telephone Encounter (Signed)
Spoke with pts son per Dr.Feng regarding bone scan. Informed pt that Bone scan was negative for bone metastasis. Pt verbalized understanding of this.

## 2017-04-15 NOTE — Progress Notes (Signed)
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    STAGE:  Stage III ( Invasive Ductal Carcinoma, ER+ Her2Neu+)  Right breast s/p mastectomy with ALND, 5.3 cm    PRIOR THERAPY: #1originally from [place]. Patient had her annual mammogram performed in November 2012 and she felt a mass. In 05/08/2011 patient underwent a right modified radical mastectomy with level II axillary clearance. Her final pathology revealed a invasive ductal carcinoma that was grade 3 measuring 5.3 cm in greatest dimension. All margins were negative. She was noted to have level lymphovascular invasion. Patient had 22 lymph nodes removed one was positive for metastatic disease without extracapsular extension.. Patient went on to receive adjuvant chemotherapy consisting of 6 cycles of Taxotere Adriamycin and Cytoxan. The tumor was ER positive PR positive HER-2/neu positive.She was then placed on tamoxifen 20 mg on a daily basis. She tolerated this quite well  CURRENT THERAPY: tamoxifen 20 mg daily since 2013  INTERVAL HISTORY:  Angela [name] 51 y.o. female returns for followup visit. She presents to the clinic today accompanied by her daughter and son who translates for her.  Her leg pain has much improved with steroids given by her PCP. The pain now is more in the calf area when she is more active in the day. She will take ibuprofen for that. She is able to walk much longer now. She does massage herself and heating pad to help with this as well. She is scheduled for mammogram on 05/09/17.  She is taking Tamoxifen and tolerating well. She no longer has a period.     MEDICAL HISTORY: Past Medical History:  Diagnosis Date  . Breast cancer ([place]) 06/18/2010   stage II  . Stomach ulcer 07/04/2012    ALLERGIES:  has No Known  Allergies.  MEDICATIONS:  Current Outpatient Medications  Medication Sig Dispense Refill  . BIOTIN 5000 PO Take by mouth.    . fish oil-omega-3 fatty acids 1000 MG capsule Take 2 g by mouth daily.    . Multiple Vitamins-Minerals (MULTIVITAMIN PO) Take 1 tablet by mouth daily.    . NON FORMULARY Risek 46m as needed for pain    . tamoxifen (NOLVADEX) 20 MG tablet Take 1 tablet (20 mg total) by mouth daily. 90 tablet 2   No current facility-administered medications for this visit.     SURGICAL HISTORY:  Past Surgical History:  Procedure Laterality Date  . BREAST SURGERY    . MASTECTOMY WITH AXILLARY LYMPH NODE DISSECTION Right 06/18/2010   T3N1    REVIEW OF SYSTEMS: 10 point review of systems as seen in HPI above.   PHYSICAL EXAMINATION: Blood pressure (!) 143/83, pulse 80, temperature 97.8 F (36.6 C), temperature source Oral, resp. rate 18, height '5\' 1"'  (1.549 m), weight 178 lb (80.7 kg), SpO2 98 %. Body mass index is 33.63 kg/m.  ECOG PERFORMANCE STATUS: 0 - Asymptomatic  PHYSICAL EXAMINATION: General appearance: alert, cooperative, appears stated age and no distress Head: Normocephalic, without obvious abnormality, atraumatic Neck: no adenopathy, no carotid bruit, no JVD, supple, symmetrical, trachea midline and thyroid not enlarged, symmetric, no tenderness/mass/nodules Lymph nodes: Cervical, supraclavicular, and axillary nodes normal. Resp: clear to auscultation bilaterally Back: symmetric, no curvature. ROM normal. No CVA tenderness. Cardio: regular rate and rhythm, S1, S2 normal, no murmur, click, rub or gallop GI:  soft, non-tender; bowel sounds normal; no masses,  no organomegaly Extremities: extremities normal, atraumatic, no cyanosis or edema Neurologic: Alert and oriented X 3, normal strength and tone. Normal symmetric reflexes. Normal coordination and gait Breasts: S/p right mastectomy, incision is well-healed without evidence of skin changes or local recurrence.  Left breast without any palpable masses, skin changes of concern nipple changes or nipple discharge. Nothing palpable in either axilla.  LABORATORY DATA: CBC Latest Ref Rng & Units 04/18/2017 02/17/2017 01/17/2017  WBC 3.9 - 10.3 10e3/uL 5.1 5.0 6.0  Hemoglobin 11.6 - 15.9 g/dL 12.7 13.4 12.4  Hematocrit 34.8 - 46.6 % 37.2 38.4 35.8(A)  Platelets 145 - 400 10e3/uL 185 205 -    CMP Latest Ref Rng & Units 04/18/2017 02/17/2017 01/17/2017  Glucose 70 - 140 mg/dl 150(H) 108 195(H)  BUN 7.0 - 26.0 mg/dL 7.7 13.3 10  Creatinine 0.6 - 1.1 mg/dL 0.8 0.8 0.76  Sodium 136 - 145 mEq/L 140 140 142  Potassium 3.5 - 5.1 mEq/L 3.9 4.0 4.1  Chloride 96 - 106 mmol/L - - 103  CO2 22 - 29 mEq/L '23 25 23  ' Calcium 8.4 - 10.4 mg/dL 9.1 9.6 9.2  Total Protein 6.4 - 8.3 g/dL 6.8 7.4 6.5  Total Bilirubin 0.20 - 1.20 mg/dL 0.43 0.50 0.3  Alkaline Phos 40 - 150 U/L 87 83 88  AST 5 - 34 U/L 46(H) 35(H) 26  ALT 0 - 55 U/L 56(H) 32 21   RADIOGRAPHIC STUDIES:  Bone Scan whole body  04/12/17 IMPRESSION: No evidence for osseous metastatic disease.   Diagnostic mammogram, left 11/04/16 IMPRESSION: Probably benign left breast findings. RECOMMENDATION: Left diagnostic mammogram in 6 months.   Digital Screening Mammogram L breast 02/19/2016 IMPRESSION: Further evaluation is suggested for possible asymmetry in the left Breast.  Diagnostic Mammogram L brest 02/27/2016 IMPRESSION: 1. There are 2 adjacent masses in the upper-outer left breast, both of which likely represent benign lymph nodes, though only 1 was definitely identified sonographically.  2.  No mammographic evidence of left breast malignancy.  Bone density scan 01/10/2015 ASSESSMENT: Patient's diagnostic category is NORMAL by WHO Criteria.  ASSESSMENT: 51 y.o. premenopausal female with  1. stage III invasive ductal carcinoma ER positive HER-2/neu positive, diagnosed in 05/2011 - status post right mastectomy with axillary lymph node dissection  for a 5.3 cm node-positive disease. Patient subsequently underwent adjuvant chemotherapy consisting of 6 cycles of Taxotere Adriamycin and Cytoxan. She did not receive anti-HER-2 therapy due to availability and financial issue. -She has been on adjuvant Tamoxifen 20 mg daily since mid year 2013. She has been tolerating it very well  without noticeable side effects. I discussed the option of continue tamoxifen for total of 10 years, versus change to aromatase inhibitor for additional 5 years if she is truly postmenopausal. I checked her [facility] and Estrogen level in 2016, she is still not in post menopause yet, we'll continue tamoxifen -She is 4.5 years out of her initial breast cancer diagnosis, we discussed that her risk of recurrence is less now. However late recurrence can be seen in ER positive breast cancer. We will continue monitoring. -Her last mammogram and ultrasound in September 2017 showed 2 adjacent masses in the upper outer left breast, likely benign lymph nodes -Her last mammogram and ultrasound in 11/04/2016 showed The previously described oval, circumscribed nodules within the posterior, superior left breast are unchanged from the September 2017 exam and are again thought to represent normal intramammary lymph nodes. No new or suspicious abnormality  is identified.September 2017 -She has developed significant right hip and leg pain, much improved with steroids, she has mild intermittent residual right calf pain without edema.  I reviewed her bone scan from October 2018, which was negative for bone metastasis. -Patient  is clinically doing well. Lab reviewed, physical exam today is unremarkable, no clinical concern for recurrent disease -I suggest she does self breast exams periodically.  -Next mammogram is 05/2017  -Continue Tamoxifen for a total of 10 years    2. Bone health  -2016 bone density scan was normal -I encouraged her to take calcium and vitamin D for bone health  -She is on daily  multivitamin  -Next DEXA Scan in 05/2017  3. Hemorrhoids -She had colonoscopy in 11/2015 -she will follow up with GI if needed -Her symptoms have resolved now  4. Lipid and BP issue -She is slightly hypertensive today. No past medical history of hypertension. I encouraged her to check her blood pressure at home -I checked her cholesterol in September 2017, which are in the upper normal limits -Her son reports it is never over 140 when he checks it at home.  -She has established care with PCP, lipid panel 08/26/16 with slightly elevate TG (167) otherwise WNL.   5. Right leg pain -Neurovascular exam WNL, Negative straight leg raise; low suspicion for bone metastasis -Xray R hip obtained 9/131/18 is negative for fracture, dislocation, arthropathy, or focal bone abnormality -Ordered bone scan on 03/12/17 that was negative for bone metastasis.  -PCP gave steroids to help pain, which help significantly.  She only has mild intermittent right calf pain, managed with ibuprofen.     Plan -Continue tamoxifen  -lab and f/u in 6 months  -mammogram and DEXA at [facility] in one months    All questions were answered. The patient knows to call the clinic with any problems, questions or concerns. We can certainly see the patient much sooner if necessary.  I spent 20 minutes counseling the patient face to face. The total time spent in the appointment was 25 minutes.  This document serves as a record of services personally performed by [name], MD. It was created on her behalf by Joslyn Devon, a trained medical scribe. The creation of this record is based on the scribe's personal observations and the provider's statements to them.    I have reviewed the above documentation for accuracy and completeness, and I agree with the above.    [name] 04/18/2017

## 2017-04-18 ENCOUNTER — Other Ambulatory Visit (HOSPITAL_BASED_OUTPATIENT_CLINIC_OR_DEPARTMENT_OTHER): Payer: Self-pay

## 2017-04-18 ENCOUNTER — Telehealth: Payer: Self-pay | Admitting: Hematology

## 2017-04-18 ENCOUNTER — Encounter: Payer: Self-pay | Admitting: Hematology

## 2017-04-18 ENCOUNTER — Ambulatory Visit (HOSPITAL_BASED_OUTPATIENT_CLINIC_OR_DEPARTMENT_OTHER): Payer: Self-pay | Admitting: Hematology

## 2017-04-18 ENCOUNTER — Other Ambulatory Visit: Payer: Self-pay | Admitting: Hematology

## 2017-04-18 VITALS — BP 143/83 | HR 80 | Temp 97.8°F | Resp 18 | Ht 61.0 in | Wt 178.0 lb

## 2017-04-18 DIAGNOSIS — C50911 Malignant neoplasm of unspecified site of right female breast: Secondary | ICD-10-CM

## 2017-04-18 DIAGNOSIS — M79604 Pain in right leg: Secondary | ICD-10-CM

## 2017-04-18 DIAGNOSIS — C773 Secondary and unspecified malignant neoplasm of axilla and upper limb lymph nodes: Secondary | ICD-10-CM

## 2017-04-18 DIAGNOSIS — Z170 Estrogen receptor positive status [ER+]: Principal | ICD-10-CM

## 2017-04-18 LAB — CBC WITH DIFFERENTIAL/PLATELET
BASO%: 0.4 % (ref 0.0–2.0)
BASOS ABS: 0 10*3/uL (ref 0.0–0.1)
EOS%: 2.5 % (ref 0.0–7.0)
Eosinophils Absolute: 0.1 10*3/uL (ref 0.0–0.5)
HCT: 37.2 % (ref 34.8–46.6)
HEMOGLOBIN: 12.7 g/dL (ref 11.6–15.9)
LYMPH%: 30.7 % (ref 14.0–49.7)
MCH: 30.2 pg (ref 25.1–34.0)
MCHC: 34.1 g/dL (ref 31.5–36.0)
MCV: 88.4 fL (ref 79.5–101.0)
MONO#: 0.5 10*3/uL (ref 0.1–0.9)
MONO%: 10.2 % (ref 0.0–14.0)
NEUT%: 56.2 % (ref 38.4–76.8)
NEUTROS ABS: 2.9 10*3/uL (ref 1.5–6.5)
Platelets: 185 10*3/uL (ref 145–400)
RBC: 4.21 10*6/uL (ref 3.70–5.45)
RDW: 12.7 % (ref 11.2–14.5)
WBC: 5.1 10*3/uL (ref 3.9–10.3)
lymph#: 1.6 10*3/uL (ref 0.9–3.3)

## 2017-04-18 LAB — COMPREHENSIVE METABOLIC PANEL
ALK PHOS: 87 U/L (ref 40–150)
ALT: 56 U/L — AB (ref 0–55)
ANION GAP: 9 meq/L (ref 3–11)
AST: 46 U/L — ABNORMAL HIGH (ref 5–34)
Albumin: 3.6 g/dL (ref 3.5–5.0)
BILIRUBIN TOTAL: 0.43 mg/dL (ref 0.20–1.20)
BUN: 7.7 mg/dL (ref 7.0–26.0)
CALCIUM: 9.1 mg/dL (ref 8.4–10.4)
CO2: 23 mEq/L (ref 22–29)
CREATININE: 0.8 mg/dL (ref 0.6–1.1)
Chloride: 108 mEq/L (ref 98–109)
EGFR: >60 mL/min/{1.73_m2} (ref >60)
Glucose: 150 mg/dl — ABNORMAL HIGH (ref 70–140)
Potassium: 3.9 mEq/L (ref 3.5–5.1)
Sodium: 140 mEq/L (ref 136–145)
TOTAL PROTEIN: 6.8 g/dL (ref 6.4–8.3)

## 2017-04-18 NOTE — Telephone Encounter (Signed)
Scheduled appt per 11/12 los - Gave patient AVS and calender per los.  

## 2017-04-19 LAB — CANCER ANTIGEN 27.29: CAN 27.29: 27.7 U/mL (ref 0.0–38.6)

## 2017-05-09 ENCOUNTER — Ambulatory Visit
Admission: RE | Admit: 2017-05-09 | Discharge: 2017-05-09 | Disposition: A | Discharge status: Home / Self Care | Payer: No Typology Code available for payment source | Source: Ambulatory Visit | Attending: Nurse Practitioner | Admitting: Nurse Practitioner

## 2017-05-09 DIAGNOSIS — C50911 Malignant neoplasm of unspecified site of right female breast: Secondary | ICD-10-CM

## 2017-05-09 DIAGNOSIS — Z170 Estrogen receptor positive status [ER+]: Principal | ICD-10-CM

## 2017-05-12 ENCOUNTER — Telehealth: Payer: Self-pay | Admitting: Nurse Practitioner

## 2017-05-12 NOTE — Telephone Encounter (Signed)
I made 2 attempts to reach patient to inform her mammogram result is normal. No answer, no voicemail. Mammogram report indicates the normal result was communicated to her at the time of the exam. She has f/u scheduled 10/2017.

## 2017-06-02 MED FILL — TAMOXIFEN 20 MG TABLET: 20 | 90 days supply | Qty: 90 | Fill #1

## 2017-07-18 ENCOUNTER — Ambulatory Visit
Admission: RE | Admit: 2017-07-18 | Discharge: 2017-07-18 | Disposition: A | Discharge status: Home / Self Care | Payer: No Typology Code available for payment source | Source: Ambulatory Visit | Attending: Hematology | Admitting: Hematology

## 2017-07-18 DIAGNOSIS — C50911 Malignant neoplasm of unspecified site of right female breast: Secondary | ICD-10-CM

## 2017-07-18 DIAGNOSIS — Z170 Estrogen receptor positive status [ER+]: Principal | ICD-10-CM

## 2017-07-25 ENCOUNTER — Telehealth: Payer: Self-pay | Admitting: Medical Oncology

## 2017-07-25 ENCOUNTER — Telehealth: Payer: Self-pay | Admitting: *Deleted

## 2017-07-25 NOTE — Telephone Encounter (Signed)
Requests bone density scan and if he does not answer leave results on his phone.

## 2017-07-25 NOTE — Telephone Encounter (Signed)
Please see my result note, and call her son back, thanks  [name] MD

## 2017-07-25 NOTE — Telephone Encounter (Signed)
Called son Fnu and left message on voice mail re:  Dexa results normal as per Dr. Ernestina Penna instructions.

## 2017-07-25 NOTE — Telephone Encounter (Signed)
-----   Message from [name], MD sent at 07/24/2017  1:24 PM EST ----- Please call pt's son and let him know the DEXA result, which is normal. Thanks.  [name]  07/24/2017

## 2017-07-29 ENCOUNTER — Telehealth: Payer: Self-pay | Admitting: *Deleted

## 2017-07-29 NOTE — Telephone Encounter (Signed)
Spoke with Fnu, and instructed him to have pt follow up with PCP for better management of depression.  Fnu understood that a scheduler will contact him with a sooner appt with Dr. Burr Medico.   Schedule message sent.

## 2017-07-29 NOTE — Telephone Encounter (Signed)
Received notes from [employer] that son Fnu called on  07/23/17 concerning about pt's confusion.   Fnu was instructed by on call provider to have pt   HOLD  Tamoxifen , and to follow up with office during business hours. Spoke with Fnu today, and was informed that pt experienced forgetfulness  X 2 past 2 - 3 months.  Pt is able to function fine with ADLs.  Son thinks pt might be depressed somewhat.   He was concerned , and wanted pt to be evaluated sooner than scheduled appts. Fnu stated pt DID NOT HOLD TAMOXIFEN  as instructed.  Pt tolerates med fine, and has no complaints. Dr. Burr Medico notified.

## 2017-08-01 ENCOUNTER — Telehealth: Payer: Self-pay | Admitting: Hematology

## 2017-08-01 NOTE — Telephone Encounter (Signed)
Appointments scheduled.  Called and left message for patients brother Fnu with date/Time per 2/22 sch msg. Letter mailed also

## 2017-08-03 ENCOUNTER — Telehealth: Payer: Self-pay

## 2017-08-03 NOTE — Telephone Encounter (Signed)
Spoke with patient son who called on yesterday to verify his mother appointment on 4/15. Per 2/27 phone message return, also mailed a letter.

## 2017-08-12 MED FILL — TAMOXIFEN 20 MG TABLET: 20 | 90 days supply | Qty: 90 | Fill #1

## 2017-09-13 IMAGING — MG 2D DIGITAL DIAGNOSTIC UNILATERAL LEFT MAMMOGRAM WITH CAD AND ADJ
6 series · 6 of 14 positions shown · non-contrast
Comparison: Previous exam(s).

CLINICAL DATA: Screening recall for a left breast asymmetry. The
patient has history of right mastectomy in 4214.

EXAM:
2D DIGITAL DIAGNOSTIC UNILATERAL LEFT MAMMOGRAM WITH CAD AND ADJUNCT
TOMO
LEFT BREAST ULTRASOUND

[L CC]
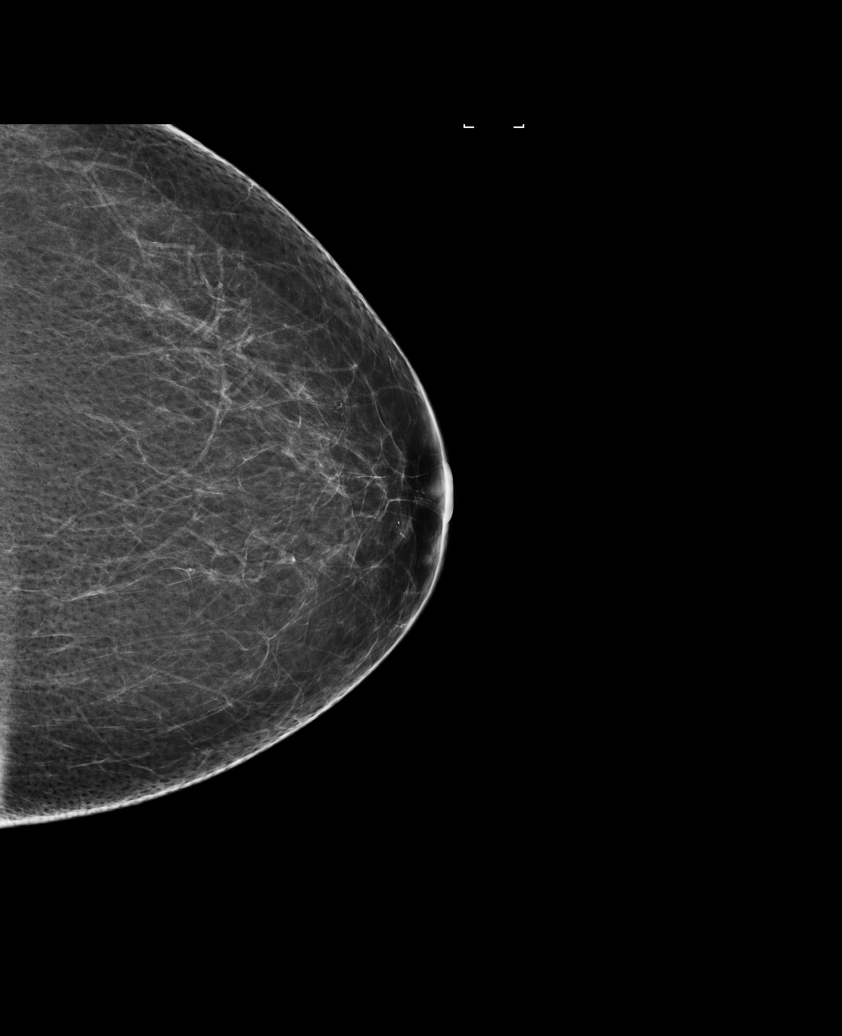

[L MLO]
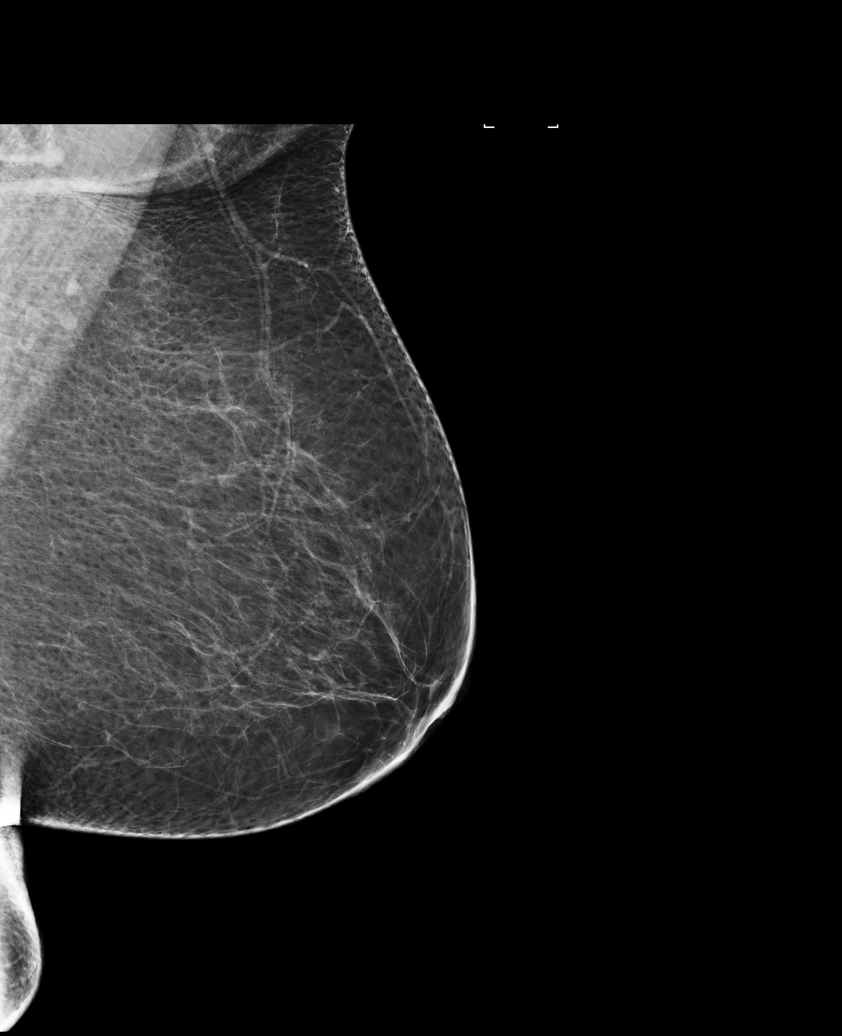

[L MLO synth-2D]
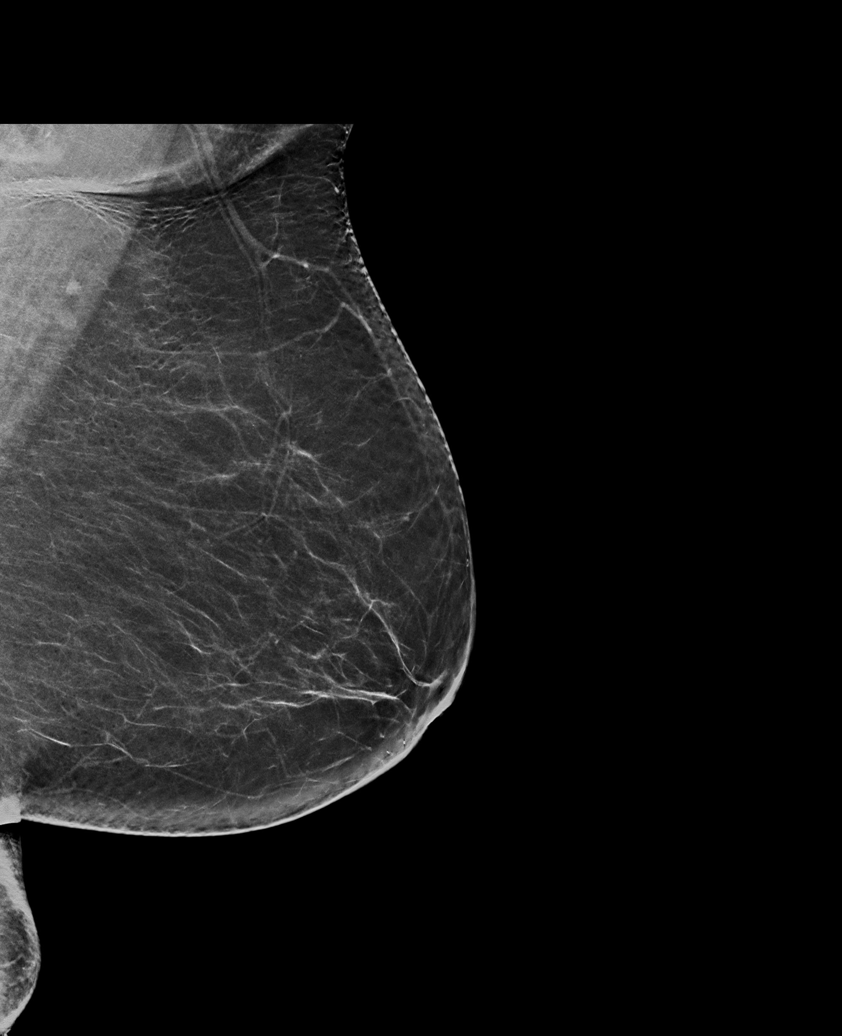

[L CC synth-2D]
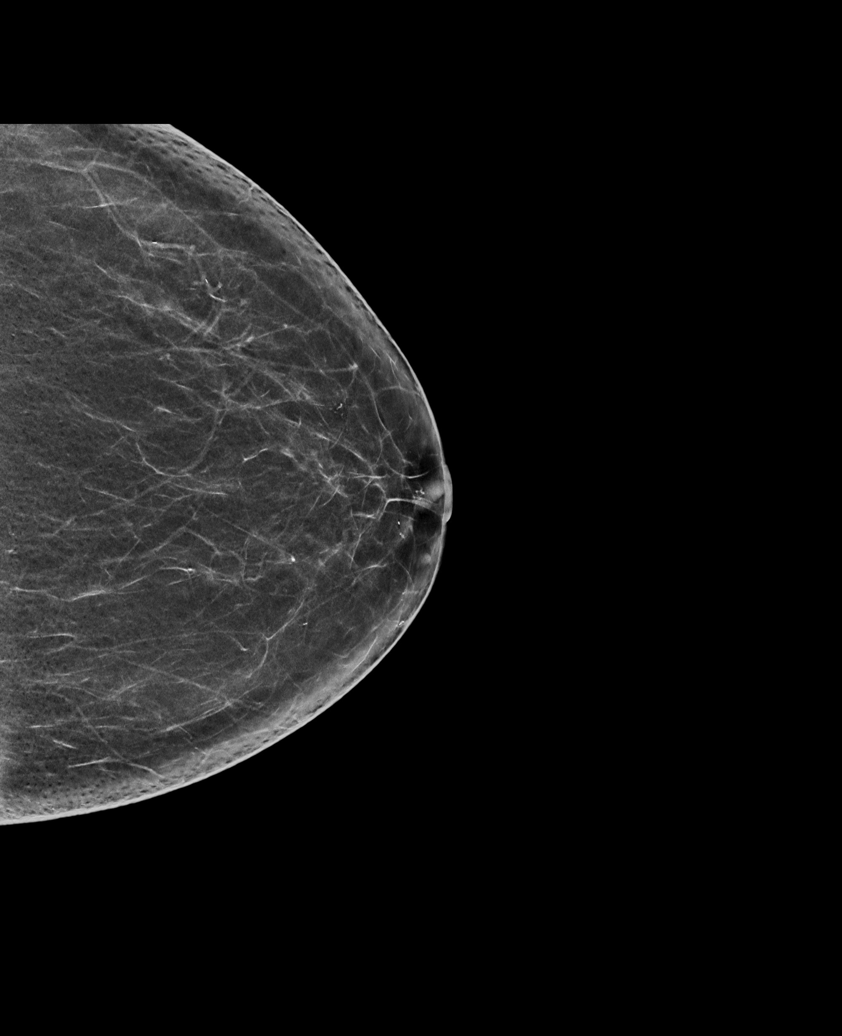

[L CC tomo · tomo slice 35/68.0]
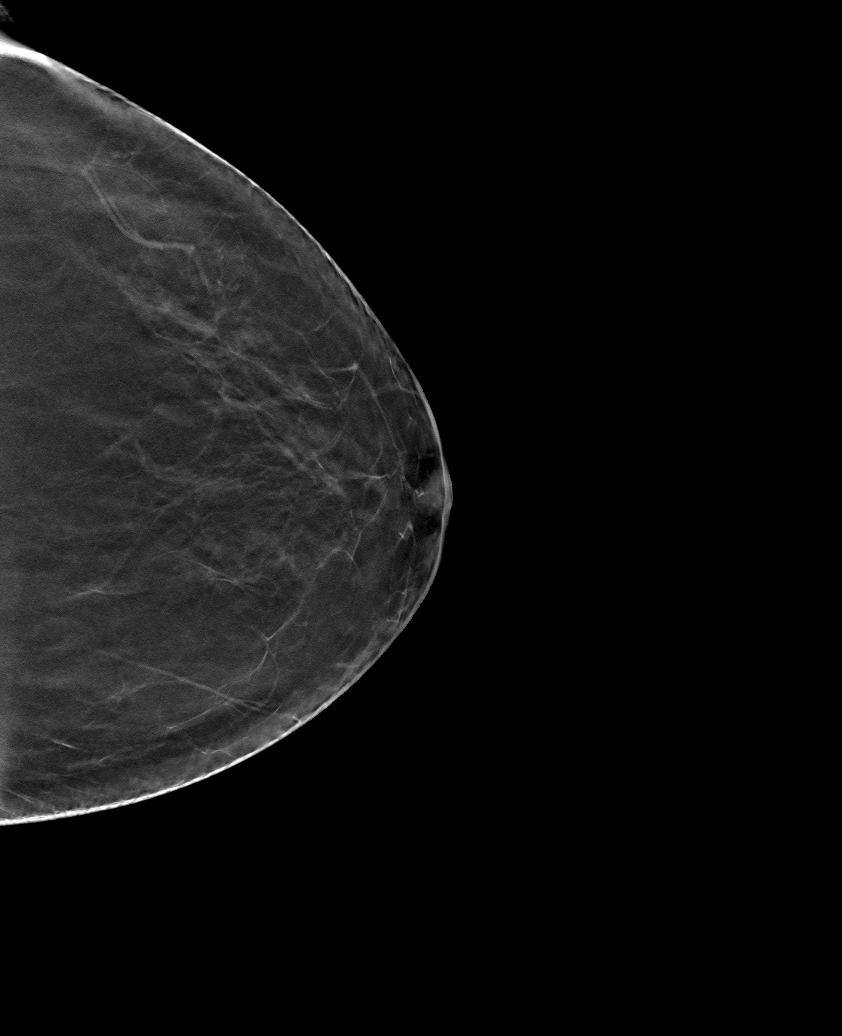

[L MLO tomo · tomo slice 37/72.0]
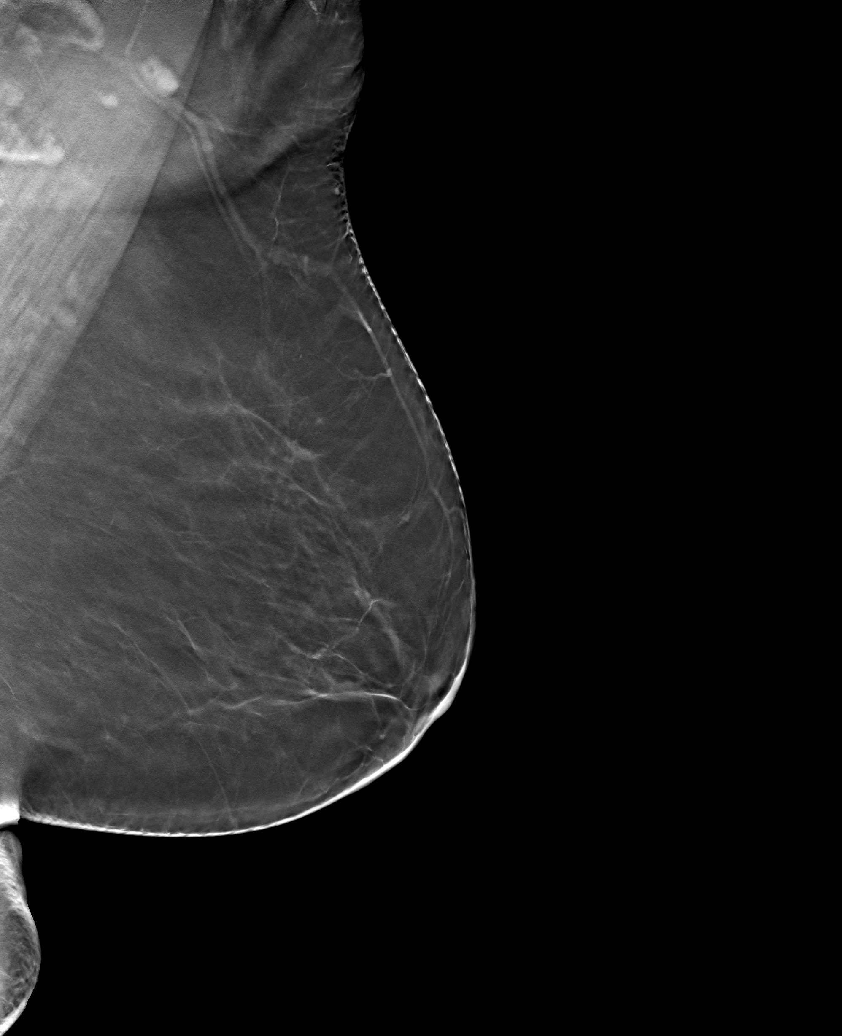

[6 of 14 positions shown; findings below may reference images not displayed]

ACR Breast Density Category b: There are scattered areas of
fibroglandular density.
FINDINGS: In the upper-outer quadrant of the left breast, there are 2 small
adjacent masses overlying the pectoralis muscle measuring 4 and 7 mm
respectively. These most likely represent lymph nodes, however
ultrasound will be performed for further evaluation.

Mammographic images were processed with CAD.

Physical exam of the upper-outer quadrant of the left breast
demonstrates normal tissue without discrete palpable mass.

Ultrasound targeted to the left breast at 2 o'clock, 8 cm from the
nipple demonstrates a normal circumscribed hypoechoic lymph node
with preserved fatty hilum measuring 4 x 2 x 4 mm. This corresponds
in size and location with the smaller of the 2 adjacent masses
identified mammographically.
IMPRESSION: 1. There are 2 adjacent masses in the upper-outer left breast, both
of which likely represent benign lymph nodes, though only 1 was
definitely identified sonographically.

2.  No mammographic evidence of left breast malignancy.

RECOMMENDATION:
Six-month follow-up diagnostic left breast mammogram is recommended
to ensure stability of the probable lymph nodes in the left axilla.

I have discussed the findings and recommendations with the patient.
Results were also provided in writing at the conclusion of the
visit. If applicable, a reminder letter will be sent to the patient
regarding the next appointment.

BI-RADS CATEGORY  3: Probably benign.

## 2017-09-16 NOTE — Progress Notes (Signed)
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    STAGE:  Stage III ( Invasive Ductal Carcinoma, ER+ Her2Neu+)  Right breast s/p mastectomy with ALND, 5.3 cm    PRIOR THERAPY: #1originally from [place]. Patient had her annual mammogram performed in November 2012 and she felt a mass. In 05/08/2011 patient underwent a right modified radical mastectomy with level II axillary clearance. Her final pathology revealed a invasive ductal carcinoma that was grade 3 measuring 5.3 cm in greatest dimension. All margins were negative. She was noted to have level lymphovascular invasion. Patient had 22 lymph nodes removed one was positive for metastatic disease without extracapsular extension.. Patient went on to receive adjuvant chemotherapy consisting of 6 cycles of Taxotere Adriamycin and Cytoxan. The tumor was ER positive PR positive HER-2/neu positive.She was then placed on tamoxifen 20 mg on a daily basis. She tolerated this quite well  CURRENT THERAPY: tamoxifen 20 mg daily since 2013  INTERVAL HISTORY:  Angela [name] 52 y.o. female returns for follow up visit. She presents to the clinic today accompanied by her son and daughter-in-law who translates for her. She reports she is doing well overall. She states her leg pain has resolved with some much needed rest. She endorses a good appetite and has lost a few lbs due to good diet. She is compliant with Tamoxifen. Her last period was 6-7 years ago.   On review of systems, pt denies any other complaints at this time. Pertinent positives are listed and detailed within the above HPI.   MEDICAL HISTORY: Past Medical History:  Diagnosis Date  . Breast cancer ([place]) 06/18/2010   stage II  . Stomach ulcer 07/04/2012    ALLERGIES:  has No Known  Allergies.  MEDICATIONS:  Current Outpatient Medications  Medication Sig Dispense Refill  . BIOTIN 5000 PO Take by mouth.    . fish oil-omega-3 fatty acids 1000 MG capsule Take 2 g by mouth daily.    . Multiple Vitamins-Minerals (MULTIVITAMIN PO) Take 1 tablet by mouth daily.    . NON FORMULARY Risek 103m as needed for pain    . tamoxifen (NOLVADEX) 20 MG tablet Take 1 tablet (20 mg total) by mouth daily. 90 tablet 2   No current facility-administered medications for this visit.     SURGICAL HISTORY:  Past Surgical History:  Procedure Laterality Date  . BREAST SURGERY    . MASTECTOMY WITH AXILLARY LYMPH NODE DISSECTION Right 06/18/2010   T3N1    REVIEW OF SYSTEMS: 10 point review of systems as seen in HPI above.   PHYSICAL EXAMINATION: Blood pressure (!) 157/81, pulse 81, temperature 97.6 F (36.4 C), temperature source Oral, resp. rate 18, height '5\' 1"'  (1.549 m), weight 174 lb 1.6 oz (79 kg), SpO2 96 %. Body mass index is 32.9 kg/m.  ECOG PERFORMANCE STATUS: 0 - Asymptomatic  PHYSICAL EXAMINATION: General appearance: alert, cooperative, appears stated age and no distress Head: Normocephalic, without obvious abnormality, atraumatic Neck: no adenopathy, no carotid bruit, no JVD, supple, symmetrical, trachea midline and thyroid not enlarged, symmetric, no tenderness/mass/nodules Lymph nodes: Cervical, supraclavicular, and axillary nodes normal. Resp: clear to auscultation bilaterally Back: symmetric, no curvature. ROM normal. No CVA tenderness. Cardio: regular rate and rhythm, S1, S2 normal, no murmur, click, rub or gallop GI: soft, non-tender; bowel sounds normal; no masses,  no organomegaly Extremities: extremities normal, atraumatic, no cyanosis or edema Neurologic: Alert and oriented X 3, normal strength and tone. Normal symmetric reflexes. Normal coordination and gait Breasts: S/p right mastectomy, incision is well-healed without evidence of skin changes or local  recurrence. Left breast without any palpable masses, skin changes of concern nipple changes or nipple discharge. Nothing palpable in either axilla.  LABORATORY DATA: CBC Latest Ref Rng & Units 09/19/2017 04/18/2017 02/17/2017  WBC 3.9 - 10.3 K/uL 6.5 5.1 5.0  Hemoglobin 11.6 - 15.9 g/dL 12.1 12.7 13.4  Hematocrit 34.8 - 46.6 % 35.4 37.2 38.4  Platelets 145 - 400 K/uL 186 185 205    CMP Latest Ref Rng & Units 09/19/2017 04/18/2017 02/17/2017  Glucose 70 - 140 mg/dL 146(H) 150(H) 108  BUN 7 - 26 mg/dL 10 7.7 13.3  Creatinine 0.60 - 1.10 mg/dL 0.75 0.8 0.8  Sodium 136 - 145 mmol/L 142 140 140  Potassium 3.5 - 5.1 mmol/L 4.0 3.9 4.0  Chloride 98 - 109 mmol/L 107 - -  CO2 22 - 29 mmol/L '27 23 25  ' Calcium 8.4 - 10.4 mg/dL 9.3 9.1 9.6  Total Protein 6.4 - 8.3 g/dL 6.7 6.8 7.4  Total Bilirubin 0.2 - 1.2 mg/dL 0.3 0.43 0.50  Alkaline Phos 40 - 150 U/L 92 87 83  AST 5 - 34 U/L 26 46(H) 35(H)  ALT 0 - 55 U/L 34 56(H) 32   RADIOGRAPHIC STUDIES:  DEXA 07/18/17 ASSESSMENT: The BMD measured at AP Spine L1-L2 is 1.190 g/cm2 with a T-score of 0.1.  Diagnostic Mammogram 05/09/18 IMPRESSION: 1. No evidence of breast malignancy. 2. Normal lymph node in the axillary region of the left breast stable since September 2017.   Bone Scan whole body  04/12/17 IMPRESSION: No evidence for osseous metastatic disease.   Diagnostic mammogram, left 11/04/16 IMPRESSION: Probably benign left breast findings. RECOMMENDATION: Left diagnostic mammogram in 6 months.   Digital Screening Mammogram L breast 02/19/2016 IMPRESSION: Further evaluation is suggested for possible asymmetry in the left Breast.  Diagnostic Mammogram L brest 02/27/2016 IMPRESSION: 1. There are 2 adjacent masses in the upper-outer left breast, both of which likely represent benign lymph nodes, though only 1 was definitely identified sonographically. 2.  No mammographic evidence of left breast malignancy.  Bone density scan  01/10/2015 ASSESSMENT: Patient's diagnostic category is NORMAL by WHO Criteria.  ASSESSMENT: 52 y.o. premenopausal female with  1. stage III invasive ductal carcinoma ER positive HER-2/neu positive, diagnosed in 05/2011 - status post right mastectomy with axillary lymph node dissection for a 5.3 cm node-positive disease in 2013. Patient subsequently underwent adjuvant chemotherapy consisting of 6 cycles of Taxotere Adriamycin and Cytoxan. She did not receive anti-HER-2 therapy due to availability and financial issue. -She has been on adjuvant Tamoxifen 20 mg daily since mid year 2013. She has been tolerating it very well  without noticeable side effects. I previously discussed the option of continue tamoxifen for total of 10 years, versus change to aromatase inhibitor for additional 5 years if she is truly postmenopausal. I checked her [facility] and Estrogen level in 2016, she is still not in post menopause yet, we'll continue tamoxifen -Her mammogram and ultrasound in September 2017 showed 2 adjacent masses in the upper outer left breast, likely benign lymph nodes. Repeat mammogram and ultrasound in 11/04/2016 showed the previously described oval, circumscribed nodules within the posterior, superior left breast are unchanged from the September 2017 exam and are again thought to represent normal intramammary lymph nodes. No new  or suspicious abnormality is identified.  -She has developed significant right hip and leg pain, much improved with steroids, she has mild intermittent residual right calf pain without edema.  I reviewed her bone scan from October 2018, which was negative for bone metastasis. -She is 6 years out of her initial breast cancer diagnosis, we discussed that her risk of recurrence is less now. However late recurrence can be seen in ER positive breast cancer. We will continue monitoring. Plan to continue Tamoxifen for a total of 10 years. -I again discussed the side effects of Tamoxifen and  advised her to watch vaginal bleeding. She has not had any. I also encouraged her to remain active to reduce her risk for blood clots.  -Patient is clinically doing well. Lab reviewed, her CBC and CMP are WNL, CA 27.29 are pending. Her physical exam today is unremarkable. Her last mammogram from 05/09/17 revealed no evidence of malignancy. There is no clinical concern for recurrent disease. -Next mammogram in Dec 2019, ordered today -Her BP was elevated in office today, she states it is normal at home when she checks it, she was just anxious about doctor's visit.  -f/u in 1 year, she sees her PCP twice a year   2. Bone health  -2016 bone density scan was normal -I encouraged her to take calcium and vitamin D for bone health  -She is on daily multivitamin  -DEXA Scan in 07/2017 was normal; T score 0.1  3. Lipid and BP issue -She was slightly hypertensive lately du. No past medical history of hypertension. I encouraged her to check her blood pressure at home -I checked her cholesterol in September 2017, which are in the upper normal limits -Her son reports her BP is never over 140 when he checks it at home.  -She has established care with PCP, lipid panel 08/26/16 with slightly elevate TG (167) otherwise WNL.   4. Right leg pain -Neurovascular exam WNL preeviously, Negative straight leg raise; low suspicion for bone metastasis -Xray R hip obtained 9/131/18 is negative for fracture, dislocation, arthropathy, or focal bone abnormality -Ordered bone scan on 03/12/17 that was negative for bone metastasis.  -PCP previously gave steroids to help pain, which helped significantly.   -She has not been walking in the winter lately, her pain has resolved now.  Likely arthritic pain.  Plan -Continue tamoxifen, plan to complete mid 2023  -lab and f/u in 1 year -Screening Mammogram in 05/2018, ordered today   All questions were answered. The patient knows to call the clinic with any problems, questions  or concerns. We can certainly see the patient much sooner if necessary.  I spent 20 minutes counseling the patient face to face. The total time spent in the appointment was 25 minutes.  This document serves as a record of services personally performed by [name], MD. It was created on her behalf by Theresia Bough, a trained medical scribe. The creation of this record is based on the scribe's personal observations and the provider's statements to them.   I have reviewed the above documentation for accuracy and completeness, and I agree with the above.   [name] 09/19/2017 3:53 PM

## 2017-09-19 ENCOUNTER — Inpatient Hospital Stay (HOSPITAL_BASED_OUTPATIENT_CLINIC_OR_DEPARTMENT_OTHER): Payer: Self-pay | Admitting: Hematology

## 2017-09-19 ENCOUNTER — Encounter: Payer: Self-pay | Admitting: Hematology

## 2017-09-19 ENCOUNTER — Inpatient Hospital Stay: Payer: Self-pay | Attending: Hematology

## 2017-09-19 ENCOUNTER — Telehealth: Payer: Self-pay | Admitting: Hematology

## 2017-09-19 VITALS — BP 157/81 | HR 81 | Temp 97.6°F | Resp 18 | Ht 61.0 in | Wt 174.1 lb

## 2017-09-19 DIAGNOSIS — Z170 Estrogen receptor positive status [ER+]: Secondary | ICD-10-CM

## 2017-09-19 DIAGNOSIS — Z9221 Personal history of antineoplastic chemotherapy: Secondary | ICD-10-CM | POA: Insufficient documentation

## 2017-09-19 DIAGNOSIS — C50911 Malignant neoplasm of unspecified site of right female breast: Secondary | ICD-10-CM

## 2017-09-19 DIAGNOSIS — Z8711 Personal history of peptic ulcer disease: Secondary | ICD-10-CM | POA: Insufficient documentation

## 2017-09-19 DIAGNOSIS — Z79810 Long term (current) use of selective estrogen receptor modulators (SERMs): Secondary | ICD-10-CM

## 2017-09-19 DIAGNOSIS — Z9011 Acquired absence of right breast and nipple: Secondary | ICD-10-CM | POA: Insufficient documentation

## 2017-09-19 DIAGNOSIS — C779 Secondary and unspecified malignant neoplasm of lymph node, unspecified: Secondary | ICD-10-CM | POA: Insufficient documentation

## 2017-09-19 LAB — COMPREHENSIVE METABOLIC PANEL
ALT: 34 U/L (ref 0–55)
AST: 26 U/L (ref 5–34)
Albumin: 3.6 g/dL (ref 3.5–5.0)
Alkaline Phosphatase: 92 U/L (ref 40–150)
Anion gap: 8 (ref 3–11)
BUN: 10 mg/dL (ref 7–26)
CO2: 27 mmol/L (ref 22–29)
Calcium: 9.3 mg/dL (ref 8.4–10.4)
Chloride: 107 mmol/L (ref 98–109)
Creatinine, Ser: 0.75 mg/dL (ref 0.60–1.10)
GFR calc Af Amer: >60 mL/min (ref >60)
GFR calc non Af Amer: >60 mL/min (ref >60)
Glucose, Bld: 146 mg/dL — ABNORMAL HIGH (ref 70–140)
Potassium: 4 mmol/L (ref 3.5–5.1)
Sodium: 142 mmol/L (ref 136–145)
Total Bilirubin: 0.3 mg/dL (ref 0.2–1.2)
Total Protein: 6.7 g/dL (ref 6.4–8.3)

## 2017-09-19 LAB — CBC WITH DIFFERENTIAL/PLATELET
Basophils Absolute: 0 10*3/uL (ref 0.0–0.1)
Basophils Relative: 1 %
Eosinophils Absolute: 0.2 10*3/uL (ref 0.0–0.5)
Eosinophils Relative: 4 %
HEMATOCRIT: 35.4 % (ref 34.8–46.6)
Hemoglobin: 12.1 g/dL (ref 11.6–15.9)
LYMPHS PCT: 28 %
Lymphs Abs: 1.8 10*3/uL (ref 0.9–3.3)
MCH: 30.4 pg (ref 25.1–34.0)
MCHC: 34.2 g/dL (ref 31.5–36.0)
MCV: 88.9 fL (ref 79.5–101.0)
Monocytes Absolute: 0.7 10*3/uL (ref 0.1–0.9)
Monocytes Relative: 10 %
NEUTROS ABS: 3.7 10*3/uL (ref 1.5–6.5)
Neutrophils Relative %: 57 %
Platelets: 186 10*3/uL (ref 145–400)
RBC: 3.98 MIL/uL (ref 3.70–5.45)
RDW: 12.3 % (ref 11.2–14.5)
WBC: 6.5 10*3/uL (ref 3.9–10.3)

## 2017-09-19 NOTE — Telephone Encounter (Signed)
Appts scheduled AVS/Calendar printed per 4/15 los °

## 2017-09-20 LAB — CANCER ANTIGEN 27.29: CAN 27.29: 30.5 U/mL (ref 0.0–38.6)

## 2017-09-21 ENCOUNTER — Telehealth: Payer: Self-pay | Admitting: *Deleted

## 2017-09-21 NOTE — Telephone Encounter (Signed)
Spoke with son and informed him of pt's CMP and CA 27.29 are all WNL.  Son voiced understanding.

## 2017-09-21 NOTE — Telephone Encounter (Signed)
-----   Message from [name], MD sent at 09/20/2017  5:23 PM EDT ----- Please let pt's son know her CMP and CAA27.29 are all WNL, no concerns. Thanks  [name]  09/20/2017

## 2017-10-17 ENCOUNTER — Other Ambulatory Visit: Payer: Self-pay

## 2017-10-17 ENCOUNTER — Ambulatory Visit: Payer: Self-pay | Admitting: Hematology

## 2017-12-06 ENCOUNTER — Other Ambulatory Visit: Payer: Self-pay | Admitting: Hematology

## 2017-12-06 DIAGNOSIS — Z170 Estrogen receptor positive status [ER+]: Secondary | ICD-10-CM

## 2017-12-06 DIAGNOSIS — C50911 Malignant neoplasm of unspecified site of right female breast: Secondary | ICD-10-CM

## 2017-12-06 DIAGNOSIS — C50919 Malignant neoplasm of unspecified site of unspecified female breast: Secondary | ICD-10-CM

## 2017-12-06 MED FILL — TAMOXIFEN 20 MG TABLET: 20 | 90 days supply | Qty: 90 | Fill #2

## 2018-01-20 ENCOUNTER — Encounter: Payer: Self-pay | Admitting: Physician Assistant

## 2018-01-24 ENCOUNTER — Ambulatory Visit: Payer: Self-pay | Admitting: Physician Assistant

## 2018-01-24 ENCOUNTER — Encounter: Payer: Self-pay | Admitting: Physician Assistant

## 2018-01-24 ENCOUNTER — Other Ambulatory Visit: Payer: Self-pay

## 2018-01-24 VITALS — BP 160/90 | HR 93 | Temp 98.0°F | Resp 16 | Ht 60.5 in | Wt 178.6 lb

## 2018-01-24 DIAGNOSIS — Z130 Encounter for screening for diseases of the blood and blood-forming organs and certain disorders involving the immune mechanism: Secondary | ICD-10-CM

## 2018-01-24 DIAGNOSIS — Z23 Encounter for immunization: Secondary | ICD-10-CM

## 2018-01-24 DIAGNOSIS — Z1389 Encounter for screening for other disorder: Secondary | ICD-10-CM

## 2018-01-24 DIAGNOSIS — M5431 Sciatica, right side: Secondary | ICD-10-CM

## 2018-01-24 DIAGNOSIS — Z131 Encounter for screening for diabetes mellitus: Secondary | ICD-10-CM

## 2018-01-24 DIAGNOSIS — Z0000 Encounter for general adult medical examination without abnormal findings: Secondary | ICD-10-CM

## 2018-01-24 DIAGNOSIS — Z1159 Encounter for screening for other viral diseases: Secondary | ICD-10-CM

## 2018-01-24 LAB — COMPREHENSIVE METABOLIC PANEL
ALBUMIN: 4.3 g/dL (ref 3.5–5.5)
ALK PHOS: 98 IU/L (ref 39–117)
ALT: 13 IU/L (ref 0–32)
AST: 18 IU/L (ref 0–40)
Albumin/Globulin Ratio: 1.7 (ref 1.2–2.2)
BUN / CREAT RATIO: 22 (ref 9–23)
BUN: 14 mg/dL (ref 6–24)
Bilirubin Total: 0.3 mg/dL (ref 0.0–1.2)
CO2: 21 mmol/L (ref 20–29)
CREATININE: 0.65 mg/dL (ref 0.57–1.00)
Calcium: 9.6 mg/dL (ref 8.7–10.2)
Chloride: 105 mmol/L (ref 96–106)
GFR, EST AFRICAN AMERICAN: 117 mL/min/{1.73_m2} (ref >59)
GFR, EST NON AFRICAN AMERICAN: 102 mL/min/{1.73_m2} (ref >59)
GLOBULIN, TOTAL: 2.5 g/dL (ref 1.5–4.5)
Glucose: 119 mg/dL — ABNORMAL HIGH (ref 65–99)
Potassium: 4.5 mmol/L (ref 3.5–5.2)
SODIUM: 141 mmol/L (ref 134–144)
TOTAL PROTEIN: 6.8 g/dL (ref 6.0–8.5)

## 2018-01-24 LAB — CBC
HEMATOCRIT: 40.4 % (ref 34.0–46.6)
HEMOGLOBIN: 12.9 g/dL (ref 11.1–15.9)
MCH: 29.8 pg (ref 26.6–33.0)
MCHC: 31.9 g/dL (ref 31.5–35.7)
MCV: 93 fL (ref 79–97)
Platelets: 234 10*3/uL (ref 150–450)
RBC: 4.33 x10E6/uL (ref 3.77–5.28)
RDW: 13.3 % (ref 12.3–15.4)
WBC: 6.6 10*3/uL (ref 3.4–10.8)

## 2018-01-24 LAB — HEMOGLOBIN A1C
ESTIMATED AVERAGE GLUCOSE: 120 mg/dL
Hgb A1c MFr Bld: 5.8 % — ABNORMAL HIGH (ref 4.8–5.6)

## 2018-01-24 MED ORDER — PREDNISONE 50 MG PO TABS: 50.0000 mg | ORAL_TABLET | Freq: Every day | ORAL | 0 refills | Status: AC

## 2018-01-24 MED ORDER — MELOXICAM 15 MG PO TABS: 15.0000 mg | ORAL_TABLET | Freq: Every day | ORAL | 1 refills | Status: AC

## 2018-01-24 MED ORDER — ZOSTER VAC RECOMB ADJUVANTED 50 MCG/0.5ML IM SUSR: 0.5000 mL | Freq: Once | INTRAMUSCULAR | 1 refills | Status: AC

## 2018-01-24 MED FILL — predniSONE 50 MG TABS: 50 | 5 days supply | Qty: 5 | Fill #0

## 2018-01-24 MED FILL — MELOXICAM 15 MG TABLET: 15 | 30 days supply | Qty: 30 | Fill #0

## 2018-01-24 NOTE — Patient Instructions (Signed)
° ° ° °  If you have lab work done today you will be contacted with your lab results within the next 2 weeks.  If you have not heard from us then please contact us. The fastest way to get your results is to register for My Chart. ° ° °IF you received an x-ray today, you will receive an invoice from [facility]. Please contact [facility] at [phone] with questions or concerns regarding your invoice.  ° °IF you received labwork today, you will receive an invoice from [facility]. Please contact [facility] at [phone] with questions or concerns regarding your invoice.  ° °Our billing staff will not be able to assist you with questions regarding bills from these companies. ° °You will be contacted with the lab results as soon as they are available. The fastest way to get your results is to activate your My Chart account. Instructions are located on the last page of this paperwork. If you have not heard from us regarding the results in 2 weeks, please contact this office. °  ° ° ° °

## 2018-01-24 NOTE — Progress Notes (Signed)
01/24/2018 5:07 PM   DOB: Aug 01, 1965 / MRN: [id number]  SUBJECTIVE:  Angela [name] is a 52 y.o. female presenting for annual exam. Most recent pap neg on 5.31.19. Colonoscopy current with a 10 year return rec. Mammogram current.  Patient with history of breast cancer.  She has a history of back pain with sciatica and the prednisone and meloxicam medication regimen worked well for her in the past. She feels well at this time and denies complaint.   She has No Known Allergies.   She  has a past medical history of Breast cancer ([place]) (06/18/2010) and Stomach ulcer (07/04/2012).    She  reports that she has never smoked. She has never used smokeless tobacco. She reports that she does not drink alcohol or use drugs. She  reports that she does not currently engage in sexual activity. The patient  has a past surgical history that includes Mastectomy with axillary lymph node dissection (Right, 06/18/2010) and Breast surgery.  Her family history includes Breast cancer (age of onset: 62) in her cousin.  Review of Systems  Constitutional: Negative for diaphoresis.  Eyes: Negative.   Respiratory: Negative for cough, hemoptysis, sputum production, shortness of breath and wheezing.   Cardiovascular: Negative for chest pain, orthopnea and leg swelling.  Gastrointestinal: Negative for abdominal pain, blood in stool, constipation, diarrhea, heartburn, melena, nausea and vomiting.  Genitourinary: Negative for dysuria, flank pain, frequency, hematuria and urgency.  Neurological: Negative for dizziness, sensory change, speech change, focal weakness and headaches.    The problem list and medications were reviewed and updated by myself where necessary and exist elsewhere in the encounter.   OBJECTIVE:  BP (!) 160/90   Pulse 93   Temp 98 F (36.7 C)   Resp 16   Ht 5' 0.5" (1.537 m)   Wt 178 lb 9.6 oz (81 kg)   SpO2 97%   BMI 34.31 kg/m   Wt Readings from Last 3 Encounters:  01/24/18 178 lb 9.6 oz (81  kg)  09/19/17 174 lb 1.6 oz (79 kg)  04/18/17 178 lb (80.7 kg)   Temp Readings from Last 3 Encounters:  01/24/18 98 F (36.7 C)  09/19/17 97.6 F (36.4 C) (Oral)  04/18/17 97.8 F (36.6 C) (Oral)   BP Readings from Last 3 Encounters:  01/24/18 (!) 160/90  09/19/17 (!) 157/81  04/18/17 (!) 143/83   Pulse Readings from Last 3 Encounters:  01/24/18 93  09/19/17 81  04/18/17 80    Physical Exam  Constitutional: She is oriented to person, place, and time. She appears well-nourished.  Non-toxic appearance. No distress.  Eyes: Pupils are equal, round, and reactive to light. EOM are normal.  Cardiovascular: Normal rate, regular rhythm, S1 normal, S2 normal, normal heart sounds and intact distal pulses. Exam reveals no gallop, no friction rub and no decreased pulses.  No murmur heard. Pulmonary/Chest: Effort normal. No stridor. No respiratory distress. She has no wheezes. She has no rales.  Abdominal: She exhibits no distension.  Musculoskeletal: She exhibits no edema.  Neurological: She is alert and oriented to person, place, and time. She has normal strength and normal reflexes. She is not disoriented. She displays no atrophy. No cranial nerve deficit or sensory deficit. She exhibits normal muscle tone. Coordination and gait normal.  Skin: Skin is warm and dry. She is not diaphoretic. No pallor.  Psychiatric: She has a normal mood and affect. Her behavior is normal.  Vitals reviewed.   Lab Results  Component Value Date  HGBA1C 6.2 02/28/2017    Lab Results  Component Value Date   WBC 6.5 09/19/2017   HGB 12.1 09/19/2017   HCT 35.4 09/19/2017   MCV 88.9 09/19/2017   PLT 186 09/19/2017    Lab Results  Component Value Date   CREATININE 0.75 09/19/2017   BUN 10 09/19/2017   NA 142 09/19/2017   K 4.0 09/19/2017   CL 107 09/19/2017   CO2 27 09/19/2017    Lab Results  Component Value Date   ALT 34 09/19/2017   AST 26 09/19/2017   ALKPHOS 92 09/19/2017   BILITOT  0.3 09/19/2017    Lab Results  Component Value Date   TSH 2.790 08/26/2016    Lab Results  Component Value Date   CHOL 173 08/26/2016   HDL 50 08/26/2016   LDLCALC 90 08/26/2016   TRIG 167 (H) 08/26/2016   CHOLHDL 3.5 08/26/2016     ASSESSMENT AND PLAN:  Angela [name] was seen today for annual exam.  Diagnoses and all orders for this visit:  Annual physical exam  Need for prophylactic vaccination with combined diphtheria-tetanus-pertussis (DTP) vaccine -     Tdap vaccine greater than or equal to 7yo IM  Screening for diabetes mellitus -     Hemoglobin A1c  Screening for nephropathy -     Comprehensive metabolic panel -     Urinalysis, dipstick only  Screening for deficiency anemia -     CBC  Screening for measles -     Measles/Mumps/Rubella Immunity  Sciatica of right side -     predniSONE (DELTASONE) 50 MG tablet; Take 1 tablet (50 mg total) by mouth daily with breakfast for 5 days. Second line option. -     meloxicam (MOBIC) 15 MG tablet; Take 1 tablet (15 mg total) by mouth daily. First line option.  Need for shingles vaccine -     Zoster Vaccine Adjuvanted [facility]) injection; Inject 0.5 mLs into the muscle once for 1 dose. Due for booster in 6 months.  Need for prophylactic vaccination against Streptococcus pneumoniae (pneumococcus) -     Pneumococcal polysaccharide vaccine 23-valent greater than or equal to 2yo subcutaneous/IM    The patient is advised to call or return to clinic if she does not see an improvement in symptoms, or to seek the care of the closest emergency department if she worsens with the above plan.   Philis Fendt, MHS, PA-C [facility] 01/24/2018 5:07 PM

## 2018-01-25 LAB — URINALYSIS, DIPSTICK ONLY
BILIRUBIN UA: NEGATIVE
Glucose, UA: NEGATIVE
Ketones, UA: NEGATIVE
Leukocytes, UA: NEGATIVE
NITRITE UA: NEGATIVE
PH UA: 5.5 (ref 5.0–7.5)
Protein, UA: NEGATIVE
RBC, UA: NEGATIVE
Specific Gravity, UA: 1.018 (ref 1.005–1.030)
UUROB: 0.2 mg/dL (ref 0.2–1.0)

## 2018-01-25 LAB — MEASLES/MUMPS/RUBELLA IMMUNITY
MUMPS ABS, IGG: 12.6 [AU]/ml (ref >10.9)
Rubella Antibodies, IGG: 1.45 index (ref >0.99)

## 2018-01-27 ENCOUNTER — Encounter: Payer: Self-pay | Admitting: Radiology

## 2018-03-07 MED FILL — TAMOXIFEN CITRATE 20 MG TAB: 20 | 90 days supply | Qty: 90 | Fill #0

## 2018-05-29 MED FILL — TAMOXIFEN CITRATE 20 MG TAB: 20 | 90 days supply | Qty: 90 | Fill #1

## 2018-06-28 ENCOUNTER — Encounter (HOSPITAL_COMMUNITY): Payer: Self-pay

## 2018-06-28 ENCOUNTER — Emergency Department (HOSPITAL_COMMUNITY)
Admission: EM | Admit: 2018-06-28 | Discharge: 2018-06-29 | Disposition: A | Discharge status: Home / Self Care | Payer: Self-pay | Attending: Emergency Medicine | Admitting: Emergency Medicine

## 2018-06-28 ENCOUNTER — Other Ambulatory Visit: Payer: Self-pay

## 2018-06-28 DIAGNOSIS — Z79899 Other long term (current) drug therapy: Secondary | ICD-10-CM | POA: Insufficient documentation

## 2018-06-28 DIAGNOSIS — I10 Essential (primary) hypertension: Secondary | ICD-10-CM

## 2018-06-28 DIAGNOSIS — Z853 Personal history of malignant neoplasm of breast: Secondary | ICD-10-CM | POA: Insufficient documentation

## 2018-06-28 NOTE — ED Notes (Signed)
Pt escorted with son. Pt denies HA at this time. Pt had episode today of nausea, HA, and dizziness today. Pt denies any pain at this time or discomfort at this time. Pt son states that home BP was in the 180-200's. Pt does not have a hx of HTN per son.

## 2018-06-28 NOTE — ED Triage Notes (Signed)
Pts son reports high BP today at home. Pt reports that she feels dizzy and had a painful headache earlier but no headache at this time. Pt does not take medication for high BP. Pt has equal grips, sensation, and strength in all extremities. Pt has no facial droop.

## 2018-06-29 NOTE — Discharge Instructions (Addendum)
We saw in the ER for elevated blood pressure. The BP has gotten better while you have waited in the emergency room. It is prudent that you start logging her blood pressures at home.  As discussed, take the BP at the same time every day with the same device.  If you notice that the blood pressures are consistently high, then please follow-up with your primary care doctor in 1 week.  Return to the ER immediately if the blood pressure is over 220/120, or if there is any associated chest pain, severe headache, new neurologic symptoms such as one-sided weakness, numbness, slurred speech.

## 2018-06-30 ENCOUNTER — Ambulatory Visit
Admission: RE | Admit: 2018-06-30 | Discharge: 2018-06-30 | Disposition: A | Discharge status: Home / Self Care | Payer: No Typology Code available for payment source | Source: Ambulatory Visit | Attending: Hematology | Admitting: Hematology

## 2018-06-30 ENCOUNTER — Encounter: Payer: Self-pay | Admitting: Radiology

## 2018-06-30 ENCOUNTER — Other Ambulatory Visit: Payer: Self-pay | Admitting: Hematology

## 2018-06-30 DIAGNOSIS — Z170 Estrogen receptor positive status [ER+]: Principal | ICD-10-CM

## 2018-06-30 DIAGNOSIS — C50911 Malignant neoplasm of unspecified site of right female breast: Secondary | ICD-10-CM

## 2018-06-30 NOTE — ED Provider Notes (Signed)
[facility] DEPT Provider Note   CSN: [id number] Arrival date & time: 06/28/18  2050     History   Chief Complaint Chief Complaint  Patient presents with  . Dizziness  . Headache  . Hypertension    HPI Angela [name] is a 53 y.o. female.  HPI 53 year old female comes into the ER with chief complaint of dizziness and elevated blood pressure.  Patient reports that she started having dizziness this afternoon.  Her family checked her blood pressure was in the 160s.  Family then went to a nearby pharmacy, where the blood pressure was over 200 therefore they decided to come to the ER.  At the moment patient's blood pressure is in the 160s.  At no point did she have any chest pain, shortness of breath, headaches, focal neurologic deficits.  Her dizziness is described as lightheadedness that was not constant.Marketing executive service was utilized.  Past Medical History:  Diagnosis Date  . Breast cancer ([place]) 06/18/2010   stage II  . Stomach ulcer 07/04/2012    Patient Active Problem List   Diagnosis Date Noted  . Prediabetes 08/29/2016  . Language barrier 09/23/2014  . Stomach ulcer 07/04/2012  . Breast cancer ([place]) 07/04/2012  . Malignant neoplasm of right female breast ([place]) 06/26/2012    Past Surgical History:  Procedure Laterality Date  . BREAST SURGERY    . MASTECTOMY WITH AXILLARY LYMPH NODE DISSECTION Right 06/18/2010   T3N1     OB History    Gravida  1   Para      Term      Preterm      AB      Living  1     SAB      TAB      Ectopic      Multiple      Live Births  1            Home Medications    Prior to Admission medications   Medication Sig Start Date End Date Taking? Authorizing Provider  BIOTIN 5000 PO Take 1 tablet by mouth daily.    Yes [provider]  tamoxifen (NOLVADEX) 20 MG tablet TAKE 1 TABLET BY MOUTH ONCE DAILY 12/06/17  Yes [name], MD  fish oil-omega-3 fatty acids 1000 MG capsule  Take 2 g by mouth daily as needed (nutrition).     [provider]  meloxicam (MOBIC) 15 MG tablet Take 1 tablet (15 mg total) by mouth daily. First line option. Patient not taking: Reported on 06/28/2018 01/24/18   Tereasa Coop, PA-C  NON FORMULARY Take 40 mg by mouth daily as needed (indigestion). Risek 40mg     [provider]  tamoxifen (NOLVADEX) 20 MG tablet Take 1 tablet (20 mg total) by mouth daily. Patient not taking: Reported on 01/24/2018 02/17/17   Alla Feeling, NP    Family History Family History  Problem Relation Age of Onset  . Breast cancer Cousin 30    Social History Social History   Tobacco Use  . Smoking status: Never Smoker  . Smokeless tobacco: Never Used  Substance Use Topics  . Alcohol use: No  . Drug use: No     Allergies   Patient has no known allergies.   Review of Systems Review of Systems  Constitutional: Negative for activity change.  Respiratory: Negative for shortness of breath.   Cardiovascular: Negative for chest pain.  Neurological: Positive for dizziness and light-headedness. Negative  for seizures, facial asymmetry, speech difficulty, weakness, numbness and headaches.  All other systems reviewed and are negative.    Physical Exam Updated Vital Signs BP (!) 157/90 (BP Location: Left Arm)   Pulse 87   Temp 98 F (36.7 C) (Oral)   Resp 16   Ht 5\' 2"  (1.575 m)   Wt 82 kg   SpO2 96%   BMI 33.06 kg/m   Physical Exam Vitals signs and nursing note reviewed.  Constitutional:      Appearance: She is well-developed.  HENT:     Head: Normocephalic and atraumatic.  Neck:     Musculoskeletal: Normal range of motion and neck supple.  Cardiovascular:     Rate and Rhythm: Normal rate.  Pulmonary:     Effort: Pulmonary effort is normal.  Abdominal:     General: Bowel sounds are normal.  Skin:    General: Skin is warm and dry.  Neurological:     Mental Status: She is alert and oriented to person, place, and  time.     GCS: GCS eye subscore is 4. GCS verbal subscore is 5. GCS motor subscore is 6.     Cranial Nerves: No cranial nerve deficit, dysarthria or facial asymmetry.     Sensory: No sensory deficit.     Motor: No weakness.     Coordination: Coordination normal.      ED Treatments / Results  Labs (all labs ordered are listed, but only abnormal results are displayed) Labs Reviewed - No data to display  EKG None  Radiology No results found.  Procedures Procedures (including critical care time)  Medications Ordered in ED Medications - No data to display   Initial Impression / Assessment and Plan / ED Course  I have reviewed the triage vital signs and the nursing notes.  Pertinent labs & imaging results that were available during my care of the patient were reviewed by me and considered in my medical decision making (see chart for details).     Patient brought into the ER with chief complaint of elevated blood pressure.  She had dizziness earlier in the evening, which led to blood pressure check which was high.  Subsequently the dizziness resolved, however family had the BP rechecked at nearby pharmacy where the blood pressure was significantly high. Without any intervention, patient's blood pressure has gotten much better.  We have advised them to follow-up with her PCP for further evaluation.  I do not think patient needs any kind of work-up for her isolated elevated blood pressure as she is currently asymptomatic.  Final Clinical Impressions(s) / ED Diagnoses   Final diagnoses:  Hypertension, unspecified type    ED Discharge Orders    None       Varney Biles, MD 06/30/18 [phone]

## 2018-07-03 ENCOUNTER — Other Ambulatory Visit: Payer: Self-pay | Admitting: Hematology

## 2018-07-03 DIAGNOSIS — R928 Other abnormal and inconclusive findings on diagnostic imaging of breast: Secondary | ICD-10-CM

## 2018-07-04 ENCOUNTER — Other Ambulatory Visit: Payer: Self-pay | Admitting: Hematology

## 2018-07-04 ENCOUNTER — Ambulatory Visit
Admission: RE | Admit: 2018-07-04 | Discharge: 2018-07-04 | Disposition: A | Discharge status: Home / Self Care | Payer: No Typology Code available for payment source | Source: Ambulatory Visit | Attending: Hematology | Admitting: Hematology

## 2018-07-04 DIAGNOSIS — R928 Other abnormal and inconclusive findings on diagnostic imaging of breast: Secondary | ICD-10-CM

## 2018-08-15 ENCOUNTER — Ambulatory Visit
Admission: RE | Admit: 2018-08-15 | Discharge: 2018-08-15 | Disposition: A | Discharge status: Home / Self Care | Payer: No Typology Code available for payment source | Source: Ambulatory Visit | Attending: Hematology | Admitting: Hematology

## 2018-08-15 ENCOUNTER — Other Ambulatory Visit: Payer: Self-pay | Admitting: Hematology

## 2018-08-15 DIAGNOSIS — R928 Other abnormal and inconclusive findings on diagnostic imaging of breast: Secondary | ICD-10-CM

## 2018-08-21 MED FILL — TAMOXIFEN CITRATE 20 MG TAB: 20 | 90 days supply | Qty: 90 | Fill #2

## 2018-08-29 ENCOUNTER — Other Ambulatory Visit: Payer: Self-pay | Admitting: Hematology

## 2018-08-29 DIAGNOSIS — R599 Enlarged lymph nodes, unspecified: Secondary | ICD-10-CM

## 2018-09-04 IMAGING — CR DG HIP (WITH OR WITHOUT PELVIS) 1V*R*
3 series · 3 of 3 positions shown · non-contrast
Comparison: None.

CLINICAL DATA: RIGHT leg pain for 2 months. History of breast
cancer.

EXAM:
DG HIP (WITH OR WITHOUT PELVIS) 1V RIGHT

[t pelvis a.p.]
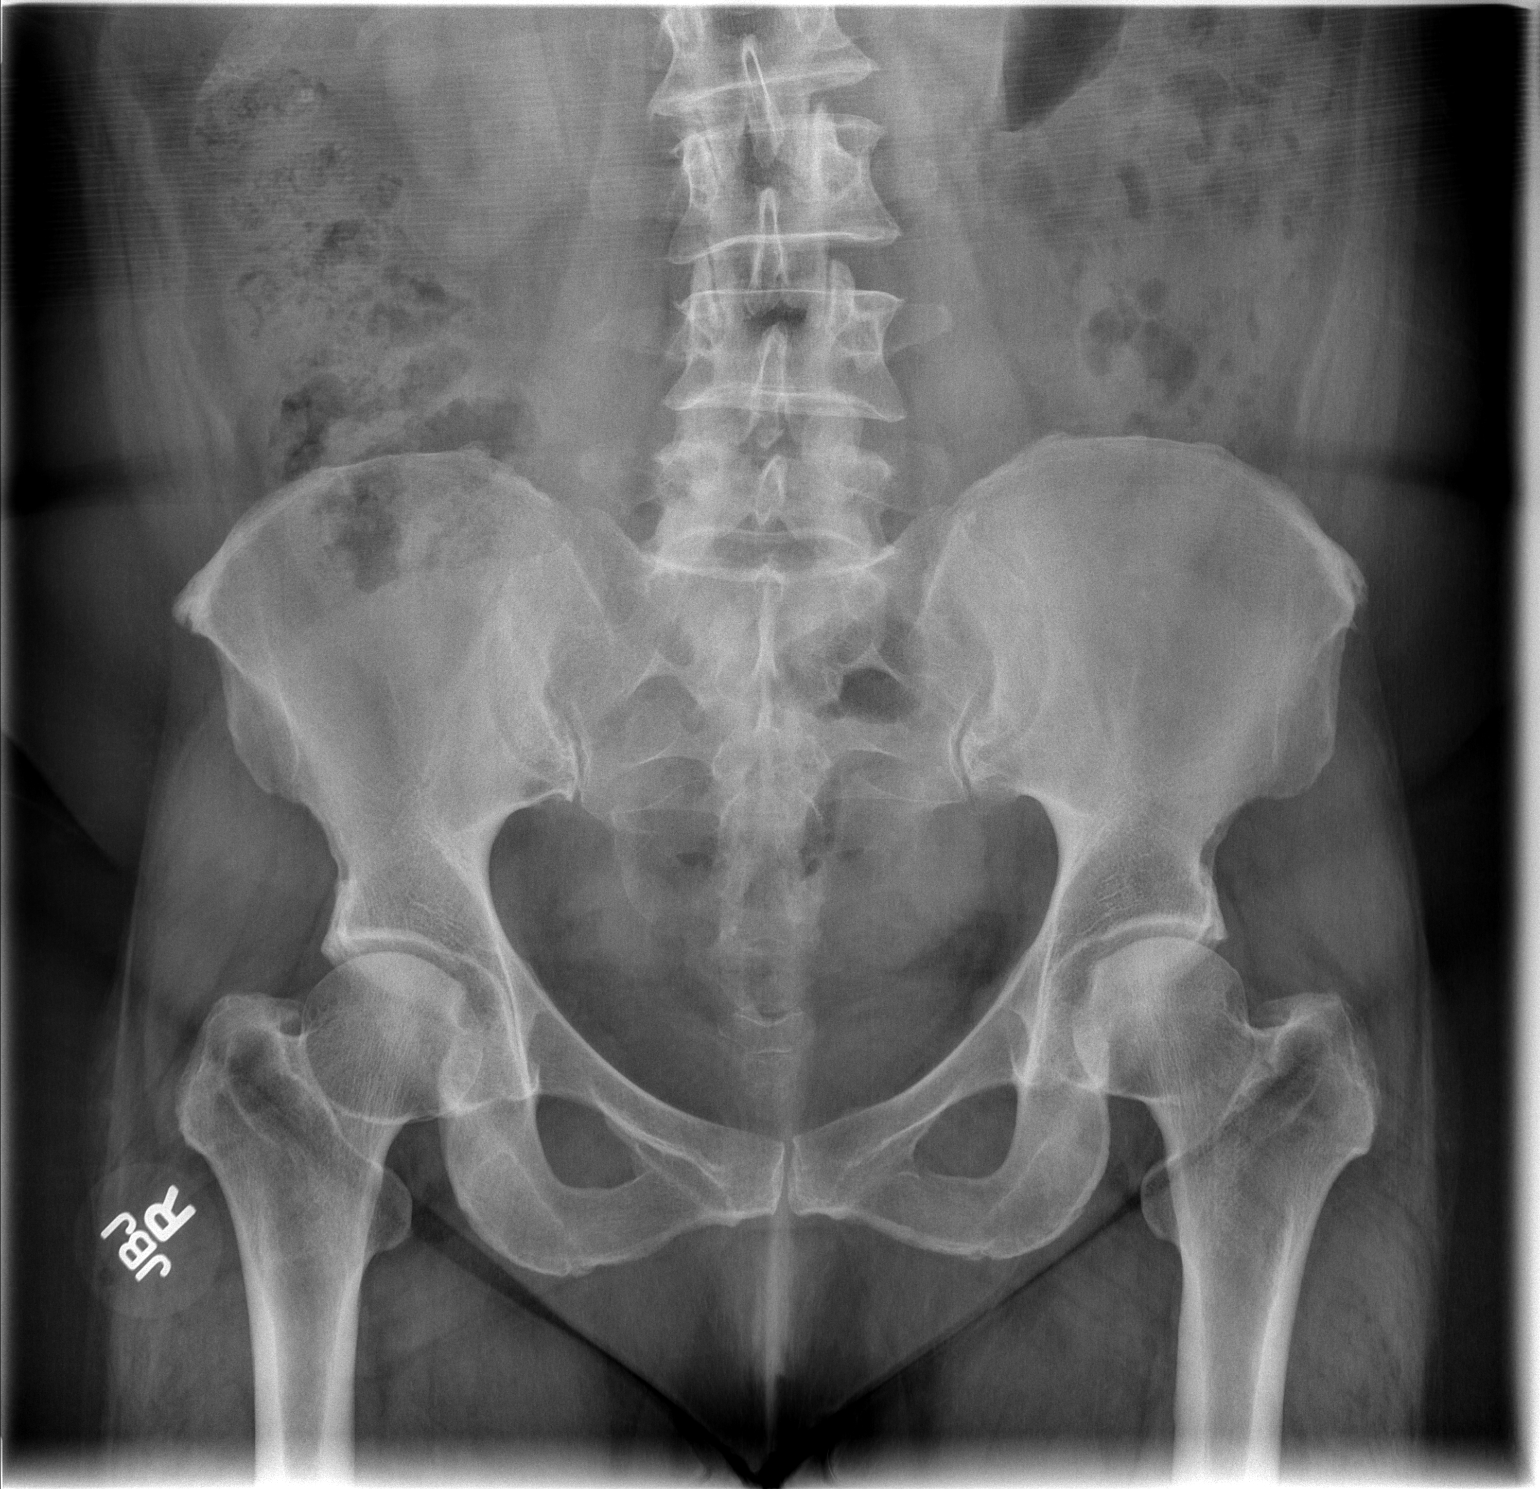

[t hip ap right]
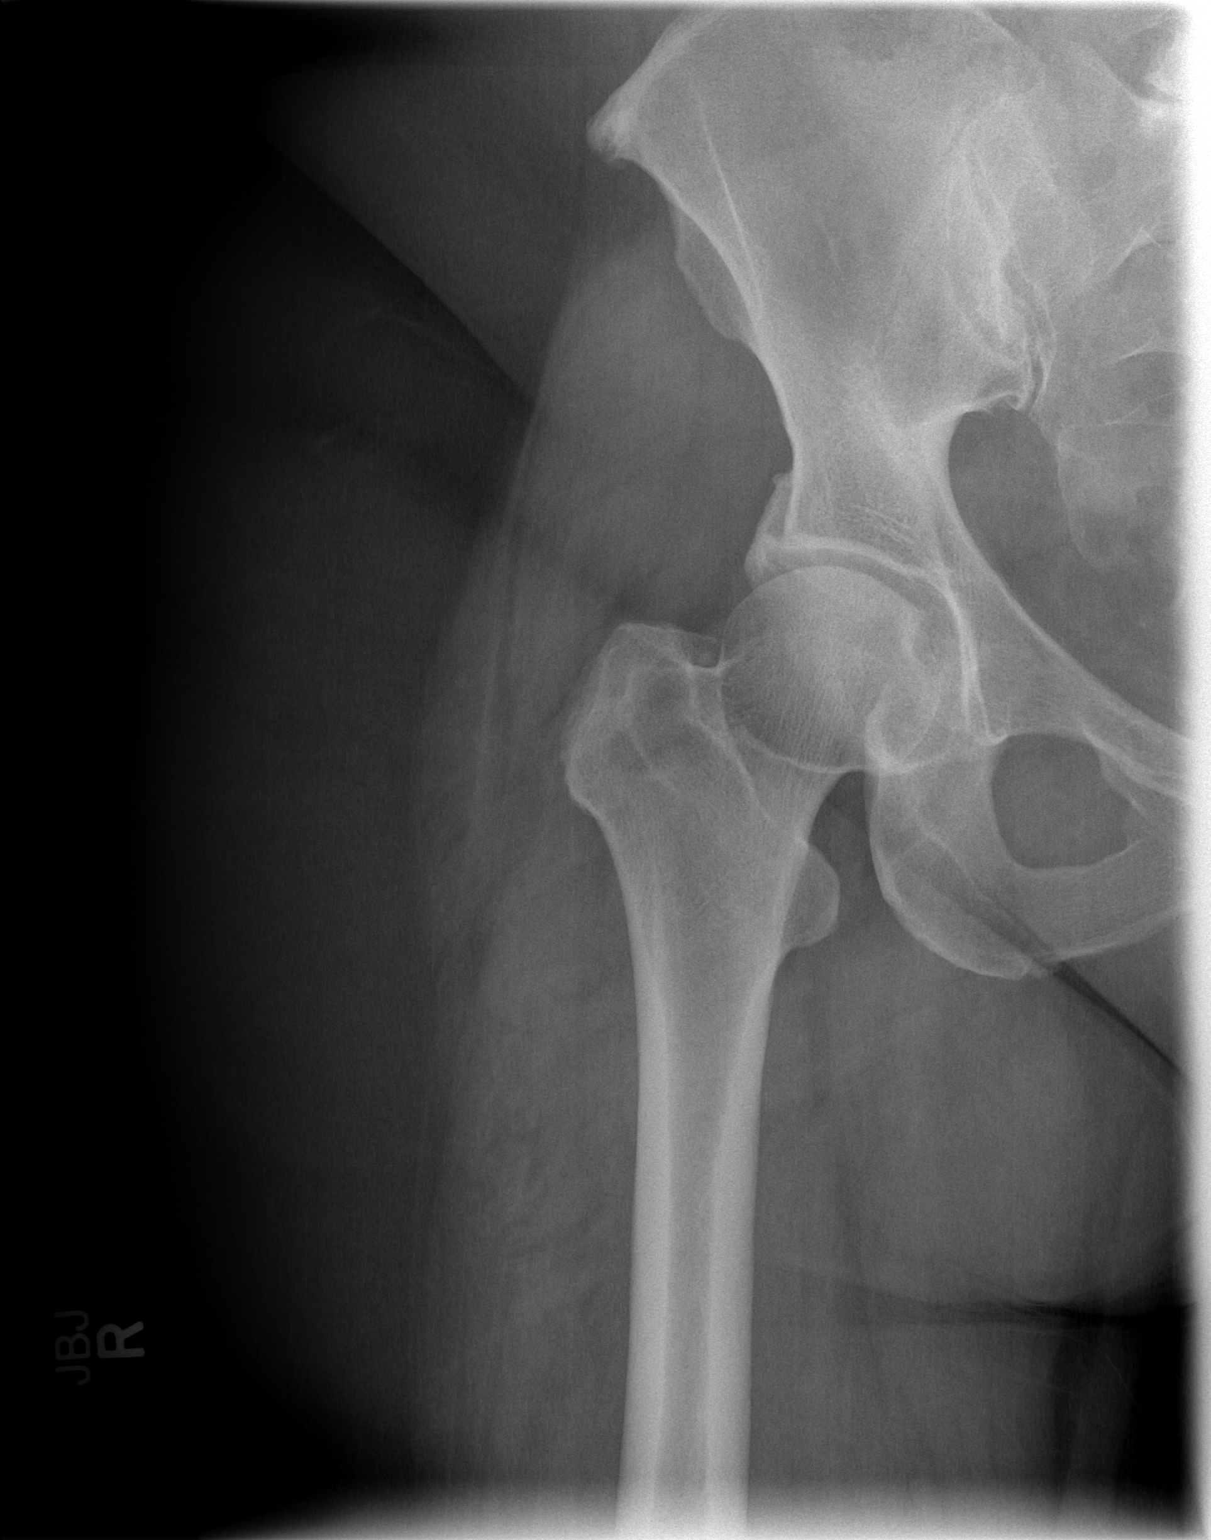

[t hip frog leg right]
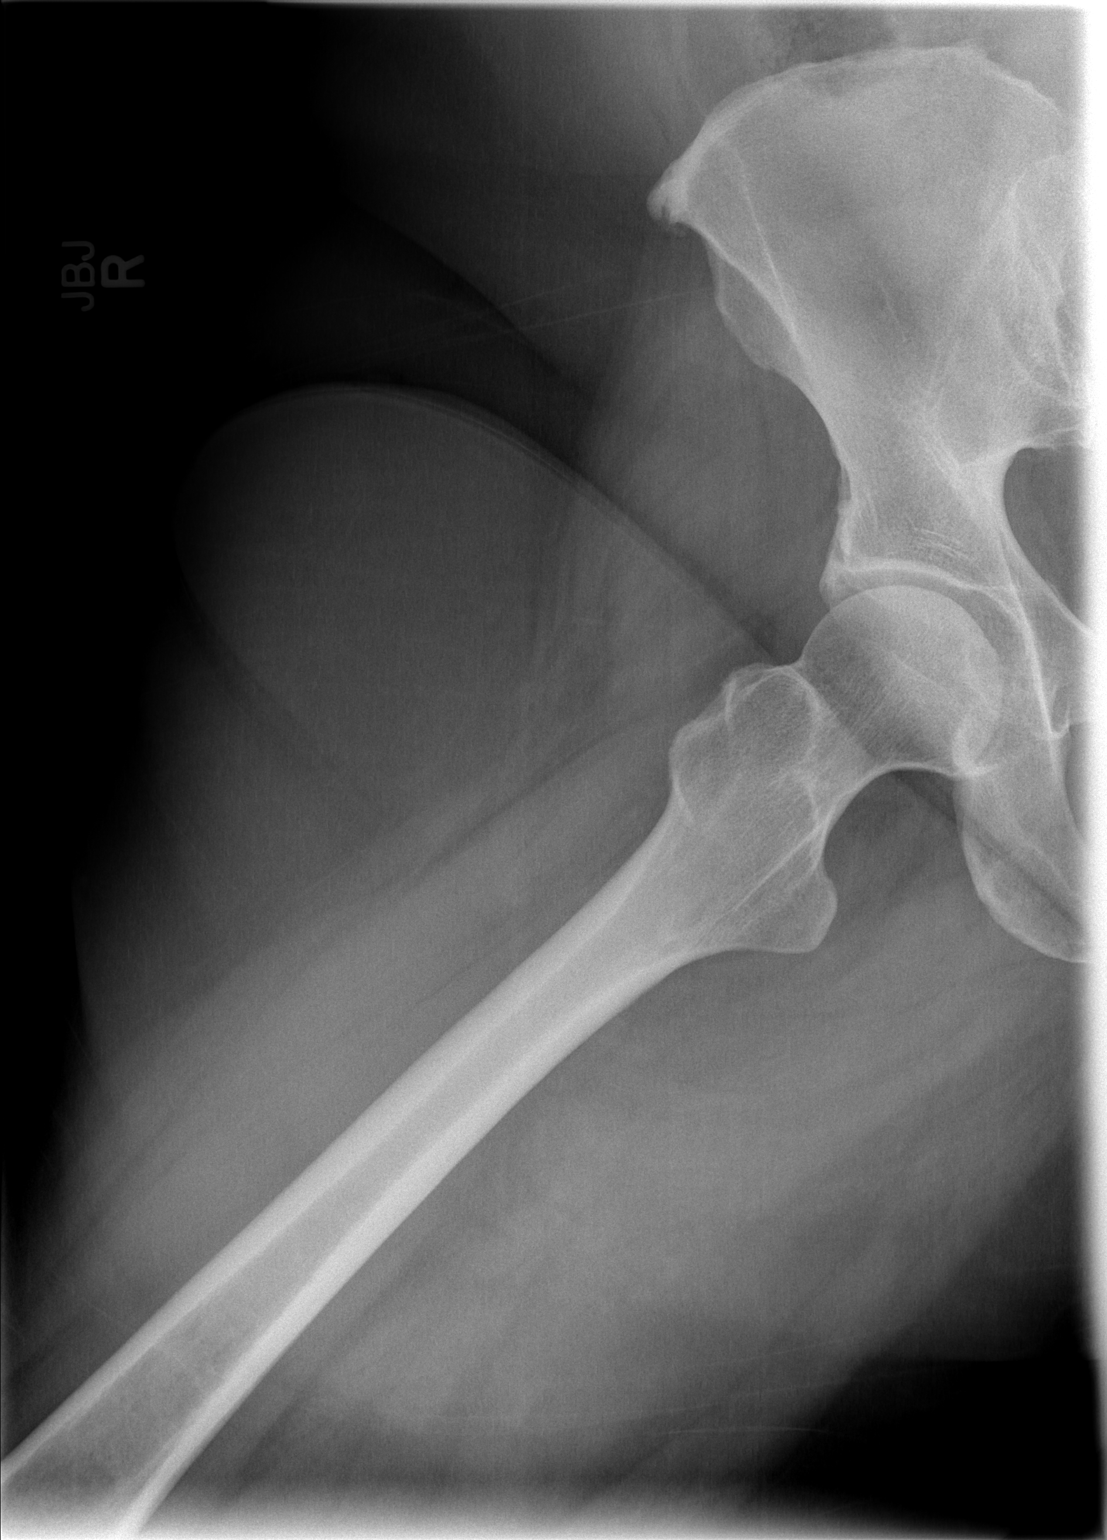

[3 of 3 positions shown; findings below may reference images not displayed]

FINDINGS: There is no evidence of hip fracture or dislocation. There is no
evidence of arthropathy or other focal bone abnormality.
IMPRESSION: Negative.

## 2018-09-15 NOTE — Progress Notes (Signed)
[place]   [phone] [phone]   Clinic Follow up Note   Patient Care Team: [name], MD as PCP - General (Hematology)   I connected with Angela [name] on 09/18/2018  at 10:30 AM EDT by telephone visit and verified that I am speaking with the correct person using two identifiers.  I discussed the limitations, risks, security and privacy concerns of performing an evaluation and management service by telephone and the availability of in person appointments. I also discussed with the patient that there may be a patient responsible charge related to this service. The patient expressed understanding and agreed to proceed.   Other persons participating in the visit and their role in the encounter:  Her son to help translate  Patient's location:  Her home  Provider's location:  My Office   CHIEF COMPLAINT: F/u of right breast cancer   SUMMARY OF ONCOLOGIC HISTORY: Oncology History   Cancer Staging No matching staging information was found for the patient.       Malignant neoplasm of right female breast [facility])   04/2011 Mammogram    annual mammogram performed in November 2012 and she felt a mass.    2012 Initial Diagnosis    Malignant neoplasm of right female breast ([place])  Stage III ( Invasive Ductal Carcinoma, ER+ Her2Neu+)  Right breast s/p mastectomy with ALND, 5.3 cm     05/08/2011 Surgery    In 05/08/2011 patient underwent a right modified radical mastectomy with level II axillary clearance.  Her final pathology revealed a invasive ductal carcinoma that was grade 3 measuring 5.3 cm in greatest dimension.  All margins were negative. She was noted to have level lymphovascular invasion.  Patient had 22 lymph nodes removed one was positive for metastatic disease without extracapsular extension.     Chemotherapy    Patient went on to receive adjuvant chemotherapy consisting of 6 cycles of Taxotere Adriamycin and Cytoxan.     2013 -   Anti-estrogen oral therapy    tamoxifen 20 mg daily since 2013      CURRENT THERAPY:  tamoxifen 20 mg daily since 2013  INTERVAL HISTORY:  Angela [name] is here for a follow up of right breast cancer. She was last seen by me 1 year ago. Her son is her Optometrist and historian. He notes she is doing well. She had a mammogram in 06/2018 which showed an enlarged LN. This was seen smaller on [place]. She did not feel LN herself and she has no pain. She will repeat US in 12/2018.  She has no joint pain and is active and independent and eating adequate. She doe snot walk around as much due to COVID-19. He notes she has had no bleeding or falls. She is taking Tamoxifen and tolerating well with no issues.    REVIEW OF SYSTEMS:   Constitutional: Denies fevers, chills or abnormal weight loss Eyes: Denies blurriness of vision Ears, nose, mouth, throat, and face: Denies mucositis or sore throat Respiratory: Denies cough, dyspnea or wheezes Cardiovascular: Denies palpitation, chest discomfort or lower extremity swelling Gastrointestinal:  Denies nausea, heartburn or change in bowel habits Skin: Denies abnormal skin rashes Lymphatics: Denies new lymphadenopathy or easy bruising Neurological:Denies numbness, tingling or new weaknesses Behavioral/Psych: Mood is stable, no new changes  All other systems were reviewed with the patient and are negative.  MEDICAL HISTORY:  Past Medical History:  Diagnosis Date  . Breast cancer ([place]) 06/18/2010   stage II  . Stomach ulcer  07/04/2012    SURGICAL HISTORY: Past Surgical History:  Procedure Laterality Date  . BREAST SURGERY    . MASTECTOMY WITH AXILLARY LYMPH NODE DISSECTION Right 06/18/2010   T3N1    I have reviewed the social history and family history with the patient and they are unchanged from previous note.  ALLERGIES:  has No Known Allergies.  MEDICATIONS:  Current Outpatient Medications  Medication Sig Dispense Refill  . BIOTIN 5000 PO Take 1  tablet by mouth daily.     . fish oil-omega-3 fatty acids 1000 MG capsule Take 2 g by mouth daily as needed (nutrition).     . meloxicam (MOBIC) 15 MG tablet Take 1 tablet (15 mg total) by mouth daily. First line option. (Patient not taking: Reported on 06/28/2018) 30 tablet 1  . NON FORMULARY Take 40 mg by mouth daily as needed (indigestion). Risek 68m    . tamoxifen (NOLVADEX) 20 MG tablet Take 1 tablet (20 mg total) by mouth daily. (Patient not taking: Reported on 01/24/2018) 90 tablet 2  . tamoxifen (NOLVADEX) 20 MG tablet TAKE 1 TABLET BY MOUTH ONCE DAILY 90 tablet 2   No current facility-administered medications for this visit.     PHYSICAL EXAMINATION: ECOG PERFORMANCE STATUS: 0 - Asymptomatic  ** No vitals taken today, Exam not performed today **   LABORATORY DATA:  I have reviewed the data as listed CBC Latest Ref Rng & Units 01/24/2018 09/19/2017 04/18/2017  WBC 3.4 - 10.8 x10E3/uL 6.6 6.5 5.1  Hemoglobin 11.1 - 15.9 g/dL 12.9 12.1 12.7  Hematocrit 34.0 - 46.6 % 40.4 35.4 37.2  Platelets 150 - 450 x10E3/uL 234 186 185     CMP Latest Ref Rng & Units 01/24/2018 09/19/2017 04/18/2017  Glucose 65 - 99 mg/dL 119(H) 146(H) 150(H)  BUN 6 - 24 mg/dL 14 10 7.7  Creatinine 0.57 - 1.00 mg/dL 0.65 0.75 0.8  Sodium 134 - 144 mmol/L 141 142 140  Potassium 3.5 - 5.2 mmol/L 4.5 4.0 3.9  Chloride 96 - 106 mmol/L 105 107 -  CO2 20 - 29 mmol/L _0 Calcium 8.7 - 10.2 mg/dL 9.6 9.3 9.1  Total Protein 6.0 - 8.5 g/dL 6.8 6.7 6.8  Total Bilirubin 0.0 - 1.2 mg/dL 0.3 0.3 0.43  Alkaline Phos 39 - 117 IU/L 98 92 87  AST 0 - 40 IU/L 18 26 46(H)  ALT 0 - 32 IU/L 13 34 56(H)      RADIOGRAPHIC STUDIES: I have personally reviewed the radiological images as listed and agreed with the findings in the report. No results found.   ASSESSMENT & PLAN:  Angela Putnamis a 54y.o. female with   1. 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 She is status post right mastectomy with axillary lymph node dissection for a 5.3 cm node-positive disease in 2013. Patient subsequently underwent adjuvant chemotherapy consisting of 6 cycles of Taxotere Adriamycin and Cytoxan. She did not receive anti-HER-2 therapy due to availability and financial issue. -She has been on adjuvant Tamoxifen 20 mg daily since mid year 2013. She has been tolerating it very well  without noticeable side effects. Plan for 10 years, to complete in 2023.  -She is clinically doing well. Her 06/2018 mammogram showed possible adenopathy in the left axilla. Subsequent UKore[place]owed smaller lymph nodes, this liekly reactive and biopsy was not recommended. Will repeat UKoreain 12/2018. Pt denies any palpable axillary nodes. Otherwise no  clinical concern for recurrence. -Continue Tamoxifen  -f/u in 4 months   2. Bone health  -2016 bone density scan was normal -I encouraged her to take calcium and vitamin D for bone health  -She is on daily multivitamin  -DEXA Scan in 07/2017 was normal; T score 0.1  3. Lipid and BP issue -She has no past medical history of hypertension but been having elevated BP. I encouraged her to check her blood pressure at home -I checked her cholesterol in September 2017, which are in the upper normal limits -Continue to follow up with PCP  4. Right leg pain -Likely arthritis pain, now resolved  Plan -Continue tamoxifen, plan to complete mid 2023  -lab and f/u in 4 months  -US axilla in 12/26/2018 scheduled     No problem-specific Assessment & Plan notes found for this encounter.   No orders of the defined types were placed in this encounter.  I discussed the assessment and treatment plan with the patient. The patient was provided an opportunity to ask questions and all were answered. The patient agreed with the plan and demonstrated an understanding of the instructions.  The patient was advised to call back or seek an  in-person evaluation if the symptoms worsen or if the condition fails to improve as anticipated.   I provided 10 minutes of non face-to-face telephone visit time during this encounter, and > 50% was spent counseling as documented under my assessment & plan.    Yan Feng, MD 09/18/2018   I, Amoya Bennett, am acting as scribe for Yan Feng, MD.   I have reviewed the above documentation for accuracy and completeness, and I agree with the above.       

## 2018-09-18 ENCOUNTER — Inpatient Hospital Stay: Payer: No Typology Code available for payment source

## 2018-09-18 ENCOUNTER — Encounter: Payer: Self-pay | Admitting: Hematology

## 2018-09-18 ENCOUNTER — Telehealth: Payer: Self-pay | Admitting: Hematology

## 2018-09-18 ENCOUNTER — Inpatient Hospital Stay: Payer: Self-pay | Attending: Hematology | Admitting: Hematology

## 2018-09-18 DIAGNOSIS — Z79810 Long term (current) use of selective estrogen receptor modulators (SERMs): Secondary | ICD-10-CM

## 2018-09-18 DIAGNOSIS — Z170 Estrogen receptor positive status [ER+]: Secondary | ICD-10-CM

## 2018-09-18 DIAGNOSIS — C50911 Malignant neoplasm of unspecified site of right female breast: Secondary | ICD-10-CM

## 2018-09-18 NOTE — Telephone Encounter (Signed)
Scheduled appt per 4/13 los. °

## 2018-09-26 ENCOUNTER — Telehealth (HOSPITAL_COMMUNITY): Payer: Self-pay

## 2018-09-26 NOTE — Telephone Encounter (Signed)
Left message using [place] about getting her breast ultrasound covered through the [facility] program. Left name and number for her to call back.

## 2018-11-20 ENCOUNTER — Other Ambulatory Visit: Payer: Self-pay | Admitting: Hematology

## 2018-11-20 DIAGNOSIS — C50919 Malignant neoplasm of unspecified site of unspecified female breast: Secondary | ICD-10-CM

## 2018-11-20 MED FILL — TAMOXIFEN CITRATE 20 MG TAB: 20 | 90 days supply | Qty: 90 | Fill #0

## 2018-12-08 ENCOUNTER — Ambulatory Visit (HOSPITAL_COMMUNITY)
Admission: EM | Admit: 2018-12-08 | Discharge: 2018-12-08 | Disposition: A | Discharge status: Home / Self Care | Payer: No Typology Code available for payment source | Attending: Family Medicine | Admitting: Family Medicine

## 2018-12-08 ENCOUNTER — Encounter (HOSPITAL_COMMUNITY): Payer: Self-pay

## 2018-12-08 ENCOUNTER — Ambulatory Visit: Payer: Self-pay

## 2018-12-08 ENCOUNTER — Other Ambulatory Visit: Payer: Self-pay

## 2018-12-08 DIAGNOSIS — I10 Essential (primary) hypertension: Secondary | ICD-10-CM

## 2018-12-08 NOTE — ED Triage Notes (Signed)
Pt presents with elevated blood pressure, nausea, and headache since last night.

## 2018-12-08 NOTE — Discharge Instructions (Signed)
Keep blood pressure log daily until your next appointment with your PCP (Dr. Burr Medico). BP goal: under 140/90, but home readings should be below 150/90 Go to ER if you develop worsening headache, facial droop, weakness, chest pain, difficulty breathing.

## 2018-12-08 NOTE — ED Provider Notes (Signed)
[place]    CSN: [id number] Arrival date & time: 12/08/18  0840     History   Chief Complaint Chief Complaint  Patient presents with  . Hypertension    HPI Angela [name] is a 53 y.o. female with remote history of stomach ulcer and breast cancer, currently taking tamoxifen, presenting with her son for acute concern of headache, nausea.  Patient's son provides translation with patient's consent.  Patient states that she developed a headache around 3 or 4 AM today.  Patient symptoms resolved without intervention 30 minutes prior to today's visit.  Headache was accompanied by nausea without vomiting.  Patient son checked blood pressure at home at that time: 194/110.  Patient has reportedly been "eating out a lot "for the last week or so.  Patient eats a lot of fried, processed foods with high salt content.  Patient denies worse headache of life, change in vision, chest pain, palpitations, cough or shortness of breath.  No abdominal pain, hematuria, lower extreme edema or claudication. Of note, patient had a similar episode 6 months ago that was self-limited.  Patient denies history of stroke or heart attack.  Not currently on anticoagulation blood thinners.  Patient was evaluated in the ER for this previous episode (see 06/28/2018 ER note).  In the ER, patient's blood pressure normalized without any intervention and patient became asymptomatic.  Patient has not been placed on an antihypertensive since this episode, has been monitoring at home with systolic typically 376-283, diastolic under 90.   Past Medical History:  Diagnosis Date  . Breast cancer ([place]) 06/18/2010   stage II  . Stomach ulcer 07/04/2012    Patient Active Problem List   Diagnosis Date Noted  . Prediabetes 08/29/2016  . Language barrier 09/23/2014  . Stomach ulcer 07/04/2012  . Breast cancer ([place]) 07/04/2012  . Malignant neoplasm of right female breast ([place]) 06/26/2012    Past Surgical History:  Procedure  Laterality Date  . BREAST SURGERY    . MASTECTOMY WITH AXILLARY LYMPH NODE DISSECTION Right 06/18/2010   T3N1    OB History    Gravida  1   Para      Term      Preterm      AB      Living  1     SAB      TAB      Ectopic      Multiple      Live Births  1            Home Medications    Prior to Admission medications   Medication Sig Start Date End Date Taking? Authorizing Provider  BIOTIN 5000 PO Take 1 tablet by mouth daily.     [provider]  fish oil-omega-3 fatty acids 1000 MG capsule Take 2 g by mouth daily as needed (nutrition).     [provider]  meloxicam (MOBIC) 15 MG tablet Take 1 tablet (15 mg total) by mouth daily. First line option. Patient not taking: Reported on 06/28/2018 01/24/18   Tereasa Coop, PA-C  NON FORMULARY Take 40 mg by mouth daily as needed (indigestion). Risek 40mg     [provider]  tamoxifen (NOLVADEX) 20 MG tablet Take 1 tablet (20 mg total) by mouth daily. Patient not taking: Reported on 01/24/2018 02/17/17   Alla Feeling, NP  tamoxifen (NOLVADEX) 20 MG tablet TAKE 1 TABLET BY MOUTH ONCE DAILY 11/20/18   [name], MD    Family  History Family History  Problem Relation Age of Onset  . Breast cancer Cousin 30    Social History Social History   Tobacco Use  . Smoking status: Never Smoker  . Smokeless tobacco: Never Used  Substance Use Topics  . Alcohol use: No  . Drug use: No     Allergies   Patient has no known allergies.   Review of Systems As per HPI   Physical Exam Triage Vital Signs ED Triage Vitals  Enc Vitals Group     BP 12/08/18 0904 (!) 182/89     Pulse Rate 12/08/18 0904 82     Resp 12/08/18 0904 16     Temp 12/08/18 0904 97.7 F (36.5 C)     Temp Source 12/08/18 0904 Oral     SpO2 12/08/18 0904 100 %     Weight --      Height --      Head Circumference --      Peak Flow --      Pain Score 12/08/18 0908 3     Pain Loc --      Pain Edu? --      Excl. in  [place]? --    No data found.  Updated Vital Signs BP (!) 158/90 (BP Location: Right Arm)   Pulse 80   Temp 97.7 F (36.5 C) (Oral)   Resp 16   SpO2 100%   Visual Acuity Right Eye Distance:   Left Eye Distance:   Bilateral Distance:    Right Eye Near:   Left Eye Near:    Bilateral Near:     Physical Exam Vitals signs reviewed.  Constitutional:      General: She is not in acute distress. HENT:     Head: Normocephalic and atraumatic.     Right Ear: Tympanic membrane, ear canal and external ear normal.     Left Ear: Tympanic membrane, ear canal and external ear normal.     Nose: Nose normal.     Mouth/Throat:     Mouth: Mucous membranes are moist.     Pharynx: Oropharynx is clear. No oropharyngeal exudate or posterior oropharyngeal erythema.  Eyes:     General: No scleral icterus.       Right eye: No discharge.        Left eye: No discharge.     Extraocular Movements: Extraocular movements intact.     Conjunctiva/sclera: Conjunctivae normal.     Pupils: Pupils are equal, round, and reactive to light.  Neck:     Musculoskeletal: Normal range of motion and neck supple. No neck rigidity or muscular tenderness.  Cardiovascular:     Rate and Rhythm: Normal rate and regular rhythm.     Heart sounds: Normal heart sounds.  Pulmonary:     Effort: Pulmonary effort is normal. No respiratory distress.     Breath sounds: No wheezing or rhonchi.  Chest:     Chest wall: No tenderness.  Abdominal:     General: Abdomen is flat. Bowel sounds are normal. There is no distension.     Palpations: Abdomen is soft.     Tenderness: There is no abdominal tenderness. There is no right CVA tenderness, left CVA tenderness or guarding.  Musculoskeletal:     Comments: Full active range of motion of upper and lower extremities with 5/5 strength bilaterally and symmetric.  Lymphadenopathy:     Cervical: No cervical adenopathy.  Skin:    General: Skin is warm.  Capillary Refill: Capillary refill  takes less than 2 seconds.     Coloration: Skin is not jaundiced or pale.     Findings: No bruising.  Neurological:     General: No focal deficit present.     Mental Status: She is alert and oriented to person, place, and time.     Cranial Nerves: No cranial nerve deficit.     Sensory: No sensory deficit.     Motor: No weakness.     Coordination: Coordination normal.     Gait: Gait normal.     Deep Tendon Reflexes: Reflexes normal.  Psychiatric:        Mood and Affect: Mood normal.        Thought Content: Thought content normal.        Judgment: Judgment normal.      UC Treatments / Results  Labs (all labs ordered are listed, but only abnormal results are displayed) Labs Reviewed - No data to display  EKG   Radiology No results found.  Procedures Procedures (including critical care time)  Medications Ordered in UC Medications - No data to display  Initial Impression / Assessment and Plan / UC Course  I have reviewed the triage vital signs and the nursing notes.  Pertinent labs & imaging results that were available during my care of the patient were reviewed by me and considered in my medical decision making (see chart for details).     53 year old female currently on tamoxifen for breast cancer, in remission, presenting for concern of headache, nausea, elevated blood pressure.  Patient is asymptomatic at time of arrival, the blood pressure was initially elevated.  Patient's blood pressure was monitored throughout visit, 158/90 to discharge.  No neurocognitive deficits, low concern for stroke/TIA.  Patient states she has an appointment with pending with her PCP, encouraged patient and her son to discuss possible antihypertensive medication.  Instructed to keep blood pressure log in the interim.  Patient given strict return precautions, verbalized understanding. Final Clinical Impressions(s) / UC Diagnoses   Final diagnoses:  Essential hypertension     Discharge  Instructions     Keep blood pressure log daily until your next appointment with your PCP (Dr. Burr Medico). BP goal: under 140/90, but home readings should be below 150/90 Go to ER if you develop worsening headache, facial droop, weakness, chest pain, difficulty breathing.    ED Prescriptions    None     Controlled Substance Prescriptions [place] Controlled Substance Registry consulted? Not Applicable   Romie Minus 12/08/18 1921

## 2018-12-08 NOTE — Telephone Encounter (Signed)
Pt.'s son reports pt.'s BP this morning is 190/109 and she has a headache and nausea. No chest pain. Instructed to take pt. To ED now, verbalizes understanding.  Reason for Disposition . [5] Systolic BP  >= 638 OR Diastolic >= 756 AND [4] cardiac or neurologic symptoms (e.g., chest pain, difficulty breathing, unsteady gait, blurred vision)  Answer Assessment - Initial Assessment Questions 1. BLOOD PRESSURE: "What is the blood pressure?" "Did you take at least two measurements 5 minutes apart?"     190/109 2. ONSET: "When did you take your blood pressure?"     This morning 3. HOW: "How did you obtain the blood pressure?" (e.g., visiting nurse, automatic home BP monitor)     Home BP monitor 4. HISTORY: "Do you have a history of high blood pressure?"     No 5. MEDICATIONS: "Are you taking any medications for blood pressure?" "Have you missed any doses recently?"     No meds 6. OTHER SYMPTOMS: "Do you have any symptoms?" (e.g., headache, chest pain, blurred vision, difficulty breathing, weakness)     Headache and nausea 7. PREGNANCY: "Is there any chance you are pregnant?" "When was your last menstrual period?"     No  Protocols used: HIGH BLOOD PRESSURE-A-AH

## 2018-12-20 ENCOUNTER — Telehealth: Payer: Self-pay

## 2018-12-20 ENCOUNTER — Other Ambulatory Visit: Payer: Self-pay | Admitting: Hematology

## 2018-12-20 ENCOUNTER — Other Ambulatory Visit: Payer: Self-pay

## 2018-12-20 DIAGNOSIS — I10 Essential (primary) hypertension: Secondary | ICD-10-CM

## 2018-12-20 DIAGNOSIS — C50911 Malignant neoplasm of unspecified site of right female breast: Secondary | ICD-10-CM

## 2018-12-20 MED ORDER — LISINOPRIL 5 MG PO TABS: 5.0000 mg | ORAL_TABLET | Freq: Every day | ORAL | 1 refills | Status: AC

## 2018-12-20 MED FILL — LISINOPRIL 5 MG TABLET: 5 | 30 days supply | Qty: 30 | Fill #0

## 2018-12-20 NOTE — Progress Notes (Signed)
[place]

## 2018-12-20 NOTE — Telephone Encounter (Signed)
Spoke with patient's son who reports that his mother has had several episodes of high blood pressure, has been seen in ED and Urgent Care for this but not prescribed any blood pressure medications.    This happened once again last night BP 197/118 but within hours came down to 130s/80s.  She did not have a headache this time.  Experienced a headache with some mild vomiting with other episodes.   He is concerned and is seeking advice on what she should do.  She has a f/u appointment with Dr. Burr Medico on 8/13.  [phone]  ________________________________ Message given to Dr. Burr Medico for review.

## 2018-12-21 ENCOUNTER — Telehealth: Payer: Self-pay

## 2018-12-21 NOTE — Telephone Encounter (Signed)
Spoke with patient's son regarding [facility] program. Explained they will have to have her come in for an appointment with them prior to providing a mammogram at no cost.   He desires to pay out of pocket for the one scheduled for 7/21 but would be interested in future appointments for mammograms being covered.  He verbalized an understanding.

## 2018-12-22 ENCOUNTER — Telehealth (HOSPITAL_COMMUNITY): Payer: Self-pay | Admitting: *Deleted

## 2018-12-22 NOTE — Telephone Encounter (Signed)
[facility] Scheduler Nino Glow called and spoke with patients son to schedule appointment with [facility]. She explained [facility] and patients son refused [facility].

## 2018-12-26 ENCOUNTER — Other Ambulatory Visit: Payer: Self-pay

## 2018-12-26 ENCOUNTER — Ambulatory Visit
Admission: RE | Admit: 2018-12-26 | Discharge: 2018-12-26 | Disposition: A | Discharge status: Home / Self Care | Payer: No Typology Code available for payment source | Source: Ambulatory Visit | Attending: Hematology | Admitting: Hematology

## 2018-12-26 DIAGNOSIS — R599 Enlarged lymph nodes, unspecified: Secondary | ICD-10-CM

## 2019-01-18 ENCOUNTER — Inpatient Hospital Stay: Payer: No Typology Code available for payment source

## 2019-01-18 ENCOUNTER — Inpatient Hospital Stay: Payer: No Typology Code available for payment source | Admitting: Hematology

## 2019-01-18 NOTE — Progress Notes (Signed)
Angela [name]   Telephone:(336) [phone] Fax:(336) [phone]   Clinic Follow up Note   Patient Care Team: [name], MD as PCP - General (Hematology)  Date of Service:  01/19/2019  CHIEF COMPLAINT: F/u of right breast cancer   SUMMARY OF ONCOLOGIC HISTORY: Oncology History Overview Note  Cancer Staging No matching staging information was found for the patient.     Malignant neoplasm of right female breast [facility])  04/2011 Mammogram   annual mammogram performed in November 2012 and she felt a mass.   2012 Initial Diagnosis   Malignant neoplasm of right female breast ([place])  Stage III ( Invasive Ductal Carcinoma, ER+ Her2Neu+)  Right breast s/p mastectomy with ALND, 5.3 cm    05/08/2011 Surgery   In 05/08/2011 patient underwent a right modified radical mastectomy with level II axillary clearance.  Her final pathology revealed a invasive ductal carcinoma that was grade 3 measuring 5.3 cm in greatest dimension.  All margins were negative. She was noted to have level lymphovascular invasion.  Patient had 22 lymph nodes removed one was positive for metastatic disease without extracapsular extension.    Chemotherapy   Patient went on to receive adjuvant chemotherapy consisting of 6 cycles of Taxotere Adriamycin and Cytoxan.    2013 -  Anti-estrogen oral therapy   tamoxifen 20 mg daily since 2013   12/26/2018 Imaging   12/26/18 Left UA Axilla  IMPRESSION: The previously identified reactive lymph node in the left axilla has returned to normal.      CURRENT THERAPY:  tamoxifen 20 mg daily since 2013  INTERVAL HISTORY:  Angela [name] is here for a follow up right breast cancer. She presents to the clinic with her son who is here to translate for her. She speaks D[place] dialect.  She notes she is doing well. She has been working on losing weight. She recently stopped eating carbohydrates or oils in her diet. She has lost 7 pounds in the last month. She is also active. She  has blood pressure cuff and has been in 120-130/80s. It does go higher when in clinic. She denies chest pain and headaches currently.  Her hip pain has resolved now. She was given 2 week course steroids by urgent care which helped. She has not been seen by [facility].    REVIEW OF SYSTEMS:   Constitutional: Denies fevers, chills or abnormal weight loss Eyes: Denies blurriness of vision Ears, nose, mouth, throat, and face: Denies mucositis or sore throat Respiratory: Denies cough, dyspnea or wheezes Cardiovascular: Denies palpitation, chest discomfort or lower extremity swelling Gastrointestinal:  Denies nausea, heartburn or change in bowel habits Skin: Denies abnormal skin rashes Lymphatics: Denies new lymphadenopathy or easy bruising Neurological:Denies numbness, tingling or new weaknesses Behavioral/Psych: Mood is stable, no new changes  All other systems were reviewed with the patient and are negative.  MEDICAL HISTORY:  Past Medical History:  Diagnosis Date  . Breast cancer ([place]) 06/18/2010   stage II  . Stomach ulcer 07/04/2012    SURGICAL HISTORY: Past Surgical History:  Procedure Laterality Date  . BREAST SURGERY    . MASTECTOMY WITH AXILLARY LYMPH NODE DISSECTION Right 06/18/2010   T3N1    I have reviewed the social history and family history with the patient and they are unchanged from previous note.  ALLERGIES:  has No Known Allergies.  MEDICATIONS:  Current Outpatient Medications  Medication Sig Dispense Refill  . BIOTIN 5000 PO Take 1 tablet by mouth daily.     Marland Kitchen  fish oil-omega-3 fatty acids 1000 MG capsule Take 2 g by mouth daily as needed (nutrition).     Marland Kitchen lisinopril (ZESTRIL) 5 MG tablet Take 1 tablet (5 mg total) by mouth daily. 30 tablet 1  . NON FORMULARY Take 40 mg by mouth daily as needed (indigestion). Risek 2m    . tamoxifen (NOLVADEX) 20 MG tablet TAKE 1 TABLET BY MOUTH ONCE DAILY 90 tablet 2  . meloxicam (MOBIC) 15 MG tablet  Take 1 tablet (15 mg total) by mouth daily. First line option. (Patient not taking: Reported on 06/28/2018) 30 tablet 1  . tamoxifen (NOLVADEX) 20 MG tablet Take 1 tablet (20 mg total) by mouth daily. (Patient not taking: Reported on 01/24/2018) 90 tablet 2   No current facility-administered medications for this visit.     PHYSICAL EXAMINATION: ECOG PERFORMANCE STATUS: 0 - Asymptomatic  Vitals:   01/19/19 0823  BP: (!) 145/84  Pulse: 87  Resp: 20  Temp: 97.8 F (36.6 C)  SpO2: 100%   Filed Weights   01/19/19 0823  Weight: 165 lb 3.2 oz (74.9 kg)    GENERAL:alert, no distress and comfortable SKIN: skin color, texture, turgor are normal, no rashes or significant lesions EYES: normal, Conjunctiva are pink and non-injected, sclera clear  NECK: supple, thyroid normal size, non-tender, without nodularity LYMPH:  no palpable lymphadenopathy in the cervical, axillary  LUNGS: clear to auscultation and percussion with normal breathing effort HEART: regular rate & rhythm and no murmurs and no lower extremity edema ABDOMEN:abdomen soft, non-tender and normal bowel sounds Musculoskeletal:no cyanosis of digits and no clubbing  NEURO: alert & oriented x 3 with fluent speech, no focal motor/sensory deficits BREAST: S/p right mastectomy: Surgical incisions healed well. No palpable mass, nodules or adenopathy bilaterally. Breast exam benign.   LABORATORY DATA:  I have reviewed the data as listed CBC Latest Ref Rng & Units 01/19/2019 01/24/2018 09/19/2017  WBC 4.0 - 10.5 K/uL 6.2 6.6 6.5  Hemoglobin 12.0 - 15.0 g/dL 12.6 12.9 12.1  Hematocrit 36.0 - 46.0 % 36.7 40.4 35.4  Platelets 150 - 400 K/uL 217 234 186     CMP Latest Ref Rng & Units 01/19/2019 01/24/2018 09/19/2017  Glucose 70 - 99 mg/dL 114(H) 119(H) 146(H)  BUN 6 - 20 mg/dL _0 Creatinine 0.44 - 1.00 mg/dL 0.75 0.65 0.75  Sodium 135 - 145 mmol/L 139 141 142  Potassium 3.5 - 5.1 mmol/L 4.0 4.5 4.0  Chloride 98 - 111 mmol/L 106  105 107  CO2 22 - 32 mmol/L _1 Calcium 8.9 - 10.3 mg/dL 9.3 9.6 9.3  Total Protein 6.5 - 8.1 g/dL 7.1 6.8 6.7  Total Bilirubin 0.3 - 1.2 mg/dL 0.5 0.3 0.3  Alkaline Phos 38 - 126 U/L 87 98 92  AST 15 - 41 U/L 33 18 26  ALT 0 - 44 U/L 32 13 34      RADIOGRAPHIC STUDIES: I have personally reviewed the radiological images as listed and agreed with the findings in the report. No results found.   ASSESSMENT & PLAN:  Angela Jimersonis a 53y.o. female with   1. 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 She is status post right mastectomy with axillary lymph node dissection for a 5.3 cm node-positive diseasein 2013. Patient subsequently underwent adjuvant chemotherapy consisting of 6 cycles of Taxotere Adriamycin and Cytoxan. She did not receive anti-HER-2 therapy due to availability and financial  issue. -She has been on adjuvant Tamoxifen 20 mg daily since mid year 2013. She has been tolerating it very well without noticeable side effects. Plan for 10 years, to complete in 2023.  -She is clinically doing well. Her prior right hip pain resolved with a course of steroids. She has been on healthy diet and intentionally lost some weight  -Lab reviewed, her CBC and CMP are within normal limits. Her physical exam and her was unremarkable. There is no clinical concern for recurrence. -She had abnormality in her 06/2018 Mammogram. Following Left axilla US shows 2 borderline lymph nodes. She did have flu around that time. Repeat US in 08/2018 and 12/2018 showed this to be a reactive process, benign.  -Continue surveillance. Next mammogram in 06/2019 -Continue Tamoxifen  -F/u in 1 year    2. Bone health  -2016 bone density scan was normal -I encouraged her to take calcium and vitamin D for bone health  -She is on daily multivitamin  -DEXA Scan in 07/2017 was normal; T score 0.1. Will repeat in 5 years.   3. Lipid and  BP issue  -I checked her cholesterol in September 2017, which are in the upper normal limits -She recently has cut most carbohydrates and oils in diet. She was able to lose 7 pounds in a month to help better control her BP.  -Her BP has been well controlled at home and only elevated with physician visits.  -Continue healthy diet and exercise  -I encourage her to establish her care with a PCP, I gave her info about [facility]   4. 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 She lives with her son who helps translate.  -She does not have health insurance.  -I provided more contact information on [facility] for primary care.  -I recommend she speak with our SW about applying for orange card for financial assistance.  -I provided information and contact for free mammogram   Plan -She is clinically doing well  -Continue tamoxifen, plan to complete mid 2023 -Left Screening Mammogram in 06/2018 -Lab and f/u in 1 year    No problem-specific Assessment & Plan notes found for this encounter.   Orders Placed This Encounter  Procedures  . MM Digital Screening Unilat L    Standing Status:   Future    Standing Expiration Date:   01/19/2020    Order Specific Question:   Reason for Exam (SYMPTOM  OR DIAGNOSIS REQUIRED)    Answer:   screening    Order Specific Question:   Is the patient pregnant?    Answer:   No    Order Specific Question:   Preferred imaging location?    Answer:   [facility]   All questions were answered. The patient knows to call the clinic with any problems, questions or concerns. No barriers to learning was detected. I spent 15 minutes counseling the patient face to face. The total time spent in the appointment was 20 minutes and more than 50% was on counseling and review of test results     [name], MD 01/19/2019   I, Joslyn Devon, am acting as scribe for [name], MD.   I have reviewed the  above documentation for accuracy and completeness, and I agree with the above.

## 2019-01-19 ENCOUNTER — Encounter: Payer: Self-pay | Admitting: Hematology

## 2019-01-19 ENCOUNTER — Inpatient Hospital Stay: Payer: No Typology Code available for payment source | Attending: Hematology

## 2019-01-19 ENCOUNTER — Other Ambulatory Visit: Payer: Self-pay

## 2019-01-19 ENCOUNTER — Inpatient Hospital Stay (HOSPITAL_BASED_OUTPATIENT_CLINIC_OR_DEPARTMENT_OTHER): Payer: Self-pay | Admitting: Hematology

## 2019-01-19 ENCOUNTER — Telehealth: Payer: Self-pay | Admitting: Hematology

## 2019-01-19 VITALS — BP 145/84 | HR 87 | Temp 97.8°F | Resp 20 | Ht 62.0 in | Wt 165.2 lb

## 2019-01-19 DIAGNOSIS — C50911 Malignant neoplasm of unspecified site of right female breast: Secondary | ICD-10-CM

## 2019-01-19 DIAGNOSIS — Z79810 Long term (current) use of selective estrogen receptor modulators (SERMs): Secondary | ICD-10-CM

## 2019-01-19 DIAGNOSIS — Z791 Long term (current) use of non-steroidal anti-inflammatories (NSAID): Secondary | ICD-10-CM

## 2019-01-19 DIAGNOSIS — C773 Secondary and unspecified malignant neoplasm of axilla and upper limb lymph nodes: Secondary | ICD-10-CM

## 2019-01-19 DIAGNOSIS — Z79899 Other long term (current) drug therapy: Secondary | ICD-10-CM | POA: Insufficient documentation

## 2019-01-19 DIAGNOSIS — Z9011 Acquired absence of right breast and nipple: Secondary | ICD-10-CM | POA: Insufficient documentation

## 2019-01-19 DIAGNOSIS — Z9221 Personal history of antineoplastic chemotherapy: Secondary | ICD-10-CM | POA: Insufficient documentation

## 2019-01-19 DIAGNOSIS — Z170 Estrogen receptor positive status [ER+]: Secondary | ICD-10-CM

## 2019-01-19 DIAGNOSIS — Z1231 Encounter for screening mammogram for malignant neoplasm of breast: Secondary | ICD-10-CM

## 2019-01-19 LAB — COMPREHENSIVE METABOLIC PANEL
ALT: 32 U/L (ref 0–44)
AST: 33 U/L (ref 15–41)
Albumin: 4 g/dL (ref 3.5–5.0)
Alkaline Phosphatase: 87 U/L (ref 38–126)
Anion gap: 10 (ref 5–15)
BUN: 11 mg/dL (ref 6–20)
CO2: 23 mmol/L (ref 22–32)
Calcium: 9.3 mg/dL (ref 8.9–10.3)
Chloride: 106 mmol/L (ref 98–111)
Creatinine, Ser: 0.75 mg/dL (ref 0.44–1.00)
GFR calc Af Amer: >60 mL/min (ref >60)
GFR calc non Af Amer: >60 mL/min (ref >60)
Glucose, Bld: 114 mg/dL — ABNORMAL HIGH (ref 70–99)
Potassium: 4 mmol/L (ref 3.5–5.1)
Sodium: 139 mmol/L (ref 135–145)
Total Bilirubin: 0.5 mg/dL (ref 0.3–1.2)
Total Protein: 7.1 g/dL (ref 6.5–8.1)

## 2019-01-19 LAB — CBC WITH DIFFERENTIAL/PLATELET
Abs Immature Granulocytes: 0.01 10*3/uL (ref 0.00–0.07)
Basophils Absolute: 0 10*3/uL (ref 0.0–0.1)
Basophils Relative: 1 %
Eosinophils Absolute: 0.1 10*3/uL (ref 0.0–0.5)
Eosinophils Relative: 1 %
HCT: 36.7 % (ref 36.0–46.0)
Hemoglobin: 12.6 g/dL (ref 12.0–15.0)
Immature Granulocytes: 0 %
Lymphocytes Relative: 43 %
Lymphs Abs: 2.7 10*3/uL (ref 0.7–4.0)
MCH: 29.6 pg (ref 26.0–34.0)
MCHC: 34.3 g/dL (ref 30.0–36.0)
MCV: 86.2 fL (ref 80.0–100.0)
Monocytes Absolute: 0.7 10*3/uL (ref 0.1–1.0)
Monocytes Relative: 11 %
Neutro Abs: 2.8 10*3/uL (ref 1.7–7.7)
Neutrophils Relative %: 44 %
Platelets: 217 10*3/uL (ref 150–400)
RBC: 4.26 MIL/uL (ref 3.87–5.11)
RDW: 12 % (ref 11.5–15.5)
WBC: 6.2 10*3/uL (ref 4.0–10.5)
nRBC: 0 % (ref 0.0–0.2)

## 2019-01-19 NOTE — Telephone Encounter (Signed)
Scheduled appt per 8/14 los.  Spoke with patient son and he is aware of the appt date and time.

## 2019-01-20 LAB — CANCER ANTIGEN 27.29: CA 27.29: 26.7 U/mL (ref 0.0–38.6)

## 2019-01-22 ENCOUNTER — Telehealth: Payer: Self-pay | Admitting: *Deleted

## 2019-01-22 NOTE — Telephone Encounter (Signed)
-----   Message from [name], MD sent at 01/20/2019  3:08 PM EDT ----- Please let her son know her CMP and tumor marker CA27.29 were all WNL from yesterday, no concerns, thanks   [name]  01/20/2019

## 2019-01-22 NOTE — Telephone Encounter (Signed)
Per Dr. Burr Medico, LVM with patient to inform lab results WNL.   Instructed pt to call back with any questions/concerns.  Call back number provided.

## 2019-03-02 MED FILL — TAMOXIFEN 20 MG TABLET: 20 | 90 days supply | Qty: 90 | Fill #1

## 2019-04-26 MED FILL — TAMOXIFEN 20 MG TABLET: 20 | 90 days supply | Qty: 90 | Fill #2

## 2019-06-25 ENCOUNTER — Telehealth: Payer: Self-pay

## 2019-06-25 NOTE — Telephone Encounter (Signed)
I called her son Ramen back, her AF is new, and she is on anticoagulation now and will be on it for a few months. Due to the risk of thrombosis from Tamoxifen, I recommend her to hold it for a monht. He voiced good understanding and agrees with the plan.   [name] MD

## 2019-06-25 NOTE — Telephone Encounter (Signed)
Patient's son Ramen calls stating that the patient developed atrial flutter and is having a cardiac ablation at [facility] in [place] today.  He wants advice on whether she should discontinue the Tamoxifen or resume it later on.   [phone]

## 2019-08-23 ENCOUNTER — Telehealth: Payer: Self-pay

## 2019-08-23 NOTE — Telephone Encounter (Signed)
Ms Olenick's son Ramen called stating that his mom is still not taking tamoxifen.  He states she is on eliquis for A fib.  She will continue on Eliquis for 6 months to 1 year.  He wants to know should she restart the tamoxifen

## 2019-08-24 ENCOUNTER — Other Ambulatory Visit: Payer: Self-pay

## 2019-08-24 DIAGNOSIS — Z170 Estrogen receptor positive status [ER+]: Secondary | ICD-10-CM

## 2019-08-24 DIAGNOSIS — C50911 Malignant neoplasm of unspecified site of right female breast: Secondary | ICD-10-CM

## 2019-08-24 DIAGNOSIS — C50919 Malignant neoplasm of unspecified site of unspecified female breast: Secondary | ICD-10-CM

## 2019-08-24 MED ORDER — TAMOXIFEN CITRATE 20 MG PO TABS: 20.0000 mg | ORAL_TABLET | Freq: Every day | ORAL | 2 refills | Status: DC

## 2019-08-24 MED FILL — TAMOXIFEN 20 MG TABLET: 20 | 90 days supply | Qty: 90 | Fill #0

## 2019-08-24 NOTE — Telephone Encounter (Signed)
I spoke with Ramen, Ms Engelbert's son.  Per Dr. Burr Medico MS Luy is to restart Tamoxifen.  He verbalized understanding and requested new rx be sent to [facility] outpatient pharmacy

## 2019-11-09 MED FILL — TAMOXIFEN 20 MG TABLET: 20 | 90 days supply | Qty: 90 | Fill #1

## 2020-01-18 NOTE — Progress Notes (Signed)
[facility]   Telephone:(336) [phone] Fax:(336) [phone]   Clinic Follow up Note   Patient Care Team: Malachy Mood, MD as PCP - General (Hematology)  Date of Service:  01/21/2020  CHIEF COMPLAINT: F/u of right breast cancer  SUMMARY OF ONCOLOGIC HISTORY: Oncology History Overview Note  Cancer Staging No matching staging information was found for the patient.     Malignant neoplasm of right female breast [facility])  04/2011 Mammogram   annual mammogram performed in November 2012 and she felt a mass.   2012 Initial Diagnosis   Malignant neoplasm of right female breast ([facility])  Stage III ( Invasive Ductal Carcinoma, ER+ Her2Neu+)  Right breast s/p mastectomy with ALND, 5.3 cm    05/08/2011 Surgery   In 05/08/2011 patient underwent a right modified radical mastectomy with level II axillary clearance.  Her final pathology revealed a invasive ductal carcinoma that was grade 3 measuring 5.3 cm in greatest dimension.  All margins were negative. She was noted to have level lymphovascular invasion.  Patient had 22 lymph nodes removed one was positive for metastatic disease without extracapsular extension.    Chemotherapy   Patient went on to receive adjuvant chemotherapy consisting of 6 cycles of Taxotere Adriamycin and Cytoxan.     Radiation Therapy   Adjuvant Radiation due to node positive disease.    2013 -  Anti-estrogen oral therapy   Tamoxifen 20 mg daily since 2013. Held from 06/2019-08/2019 due to new onset Afib. Plan to complete at the end of 01/2021.    12/26/2018 Imaging   12/26/18 Left UA Axilla  IMPRESSION: The previously identified reactive lymph node in the left axilla has returned to normal.      CURRENT THERAPY:  Tamoxifen 20 mg daily since 2013. Held from 06/2019-08/2019 due to new onset Afib. Plan to complete at the end of 01/2021.   INTERVAL HISTORY:  Angela [name] is here for a follow up of right breast cancer. She was last seen by me 1 year ago. She  presents to the clinic with her son who is translating for her. After new onset Afib this year she has been following her cardiologist. She recently completed heart monitoring. She had CT chest in 12/2019 which showed lung change. She has no recent symptoms. She has COVID19 vaccines. She was sick with PNA symptoms  In February 2020 and recovered. She is not sure of the cause. She notes left chest heaviness when using her bra.     REVIEW OF SYSTEMS:   Constitutional: Denies fevers, chills or abnormal weight loss Eyes: Denies blurriness of vision Ears, nose, mouth, throat, and face: Denies mucositis or sore throat Respiratory: Denies cough, dyspnea or wheezes Cardiovascular: Denies palpitation, chest discomfort or lower extremity swelling Gastrointestinal:  Denies nausea, heartburn or change in bowel habits Skin: Denies abnormal skin rashes Lymphatics: Denies new lymphadenopathy or easy bruising Neurological:Denies numbness, tingling or new weaknesses Behavioral/Psych: Mood is stable, no new changes  All other systems were reviewed with the patient and are negative.  MEDICAL HISTORY:  Past Medical History:  Diagnosis Date  . Breast cancer ([facility]) 06/18/2010   stage II  . Stomach ulcer 07/04/2012    SURGICAL HISTORY: Past Surgical History:  Procedure Laterality Date  . BREAST SURGERY    . MASTECTOMY WITH AXILLARY LYMPH NODE DISSECTION Right 06/18/2010   T3N1    I have reviewed the social history and family history with the patient and they are unchanged from previous note.  ALLERGIES:  has  No Known Allergies.  MEDICATIONS:  Current Outpatient Medications  Medication Sig Dispense Refill  . apixaban (ELIQUIS) 5 MG TABS tablet Take 1 tablet by mouth 2 (two) times daily.    Marland Kitchen BIOTIN 5000 PO Take 1 tablet by mouth daily.     . fish oil-omega-3 fatty acids 1000 MG capsule Take 2 g by mouth daily as needed (nutrition).     Marland Kitchen lisinopril (ZESTRIL) 5 MG tablet Take 1 tablet (5 mg total) by  mouth daily. 30 tablet 1  . meloxicam (MOBIC) 15 MG tablet Take 1 tablet (15 mg total) by mouth daily. First line option. (Patient not taking: Reported on 06/28/2018) 30 tablet 1  . metoprolol succinate (TOPROL-XL) 50 MG 24 hr tablet Take by mouth.    . NON FORMULARY Take 40 mg by mouth daily as needed (indigestion). Risek '40mg'$     . tamoxifen (NOLVADEX) 20 MG tablet Take 1 tablet (20 mg total) by mouth daily. (Patient not taking: Reported on 01/24/2018) 90 tablet 2  . tamoxifen (NOLVADEX) 20 MG tablet Take 1 tablet (20 mg total) by mouth daily. 90 tablet 2   No current facility-administered medications for this visit.    PHYSICAL EXAMINATION: ECOG PERFORMANCE STATUS: 0 - Asymptomatic  Vitals:   01/21/20 0834 01/21/20 0837  BP: (!) 151/88 133/83  Pulse: 79   Resp: 18   Temp: (!) 97.1 F (36.2 C)   SpO2: 97%    Filed Weights   01/21/20 0834  Weight: 152 lb 14.4 oz (69.4 kg)    GENERAL:alert, no distress and comfortable SKIN: skin color, texture, turgor are normal, no rashes or significant lesions EYES: normal, Conjunctiva are pink and non-injected, sclera clear NECK: supple, thyroid normal size, non-tender, without nodularity LYMPH:  no palpable lymphadenopathy in the cervical, axillary  LUNGS: clear to auscultation and percussion with normal breathing effort HEART: regular rate & rhythm and no murmurs and no lower extremity edema ABDOMEN:abdomen soft, non-tender and normal bowel sounds  Musculoskeletal:no cyanosis of digits and no clubbing  NEURO: alert & oriented x 3 with fluent speech, no focal motor/sensory deficits BREAST: S/p right mastectomy: Surgical incision healed well. Left Breast exam benign.   LABORATORY DATA:  I have reviewed the data as listed CBC Latest Ref Rng & Units 01/21/2020 01/19/2019 01/24/2018  WBC 4.0 - 10.5 K/uL 5.5 6.2 6.6  Hemoglobin 12.0 - 15.0 g/dL 12.4 12.6 12.9  Hematocrit 36 - 46 % 37.2 36.7 40.4  Platelets 150 - 400 K/uL 213 217 234     CMP  Latest Ref Rng & Units 01/21/2020 01/19/2019 01/24/2018  Glucose 70 - 99 mg/dL 105(H) 114(H) 119(H)  BUN 6 - 20 mg/dL '19 11 14  '$ Creatinine 0.44 - 1.00 mg/dL 0.75 0.75 0.65  Sodium 135 - 145 mmol/L 141 139 141  Potassium 3.5 - 5.1 mmol/L 4.7 4.0 4.5  Chloride 98 - 111 mmol/L 110 106 105  CO2 22 - 32 mmol/L '22 23 21  '$ Calcium 8.9 - 10.3 mg/dL 9.7 9.3 9.6  Total Protein 6.5 - 8.1 g/dL 7.1 7.1 6.8  Total Bilirubin 0.3 - 1.2 mg/dL 0.5 0.5 0.3  Alkaline Phos 38 - 126 U/L 75 87 98  AST 15 - 41 U/L 19 33 18  ALT 0 - 44 U/L 12 32 13      RADIOGRAPHIC STUDIES: I have personally reviewed the radiological images as listed and agreed with the findings in the report. No results found.   ASSESSMENT & PLAN:  Quentina Fronek is a  54 y.o. female with    1. 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 She isstatus post right mastectomy with axillary lymph node dissection for a 5.3 cm node-positive diseasein 2013. Patient subsequently underwent adjuvant chemotherapy consisting of 6 cycles of Taxotere Adriamycin and Cytoxan. She did not receive anti-HER-2 therapy due to availability and financial issue. She also had post-mastectomy Radiation due to node positive disease.  -She has been on adjuvant Tamoxifen 20 mg daily since 2012, notes she started before chemo and RT. She has been tolerating it very well without noticeable side effects.Plan for 10 years, to complete at end of 01/2021.  -Due to new onset Afib she held Tamoxifen for 2 months in early 2021.  -From a breast cancer standpoint she is clinically doing well. Lab reviewed, her CBC and CMP are within normal limits except BG 105. She can check her A1c with her PCP, last was 5.8. Her physical exam was unremarkable. Her 09/21/19 Mammogram was negative. There is no clinical concern for recurrence  -She is almost 10 years since her cancer diagnosis. Will continue surveillance.  -Continue  Tamoxifen  -F/u in 1 year for last visit.    2. Bone health  -2016 bone density scan was normal. DEXA Scan in 07/2017 was normal; T score 0.1. Will repeat in 2023. -I encouraged her to take calcium and vitamin D for bone health. She is on daily multivitamin   3. 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 She lives with her son who helps translate.  -She does not have health insurance.  -I previously provided information and financial resources available to her including free mammograms -She moved to [place] 10 months ago with her son. She does not want to change oncologist.   4. Recent Onset Afib, s/p ablation  -Onset in 06/2019. She was treated with Ablation on 06/25/19.  -She is currently on anticoagulation Eliquis. Her son notes she may be taken off if her Afib resolved and stable. I encouraged her to continue at least until she is off Tamoxifen.  -She will continue to f/u with her cardiologist. I suggest she check her pulse Ox at home.   5. Lung lesions  -She had a CT chest in [place] on 12/26/19 which showed multiple small lung lesions. She did have flu/pneumonia in 07/2018 but did not seek medical attention. I recommend she repeat CT chest and 3-6 months, she will contact her local PCP  6. Gallbladder 2cm lesion  -Her CT chest from 12/2019 in [place] showed 2cm density in the gallbladder. She has no pain over her gallbladder.  -I recommend she have a US Gallbladder in a month and monitor on next CT chest.  -I discussed the possibilities to remove Gallbladder if indicated based on more workup.    Plan -She is clinically doing well  -Continue tamoxifen, plan to complete at the end of 01/2021.  -Mammogram in 09/2020 at local  -Lab and f/u in 1 year     No problem-specific Assessment & Plan notes found for this encounter.   No orders of the defined types were placed in this encounter.  All questions were answered. The patient knows to call the  clinic with any problems, questions or concerns. No barriers to learning was detected. The total time spent in the appointment was 30 minutes.     [name], MD 01/21/2020   I, Angela [name], am acting as scribe for [employer]  Burr Medico, MD.   I have reviewed the above documentation for accuracy and completeness, and I agree with the above.

## 2020-01-21 ENCOUNTER — Inpatient Hospital Stay: Payer: No Typology Code available for payment source

## 2020-01-21 ENCOUNTER — Encounter: Payer: Self-pay | Admitting: Hematology

## 2020-01-21 ENCOUNTER — Other Ambulatory Visit: Payer: Self-pay

## 2020-01-21 ENCOUNTER — Inpatient Hospital Stay: Payer: Self-pay | Attending: Hematology | Admitting: Hematology

## 2020-01-21 VITALS — BP 133/83 | HR 79 | Temp 97.1°F | Resp 18 | Ht 62.0 in | Wt 152.9 lb

## 2020-01-21 DIAGNOSIS — R918 Other nonspecific abnormal finding of lung field: Secondary | ICD-10-CM | POA: Insufficient documentation

## 2020-01-21 DIAGNOSIS — Z170 Estrogen receptor positive status [ER+]: Secondary | ICD-10-CM | POA: Insufficient documentation

## 2020-01-21 DIAGNOSIS — C50911 Malignant neoplasm of unspecified site of right female breast: Secondary | ICD-10-CM | POA: Insufficient documentation

## 2020-01-21 DIAGNOSIS — I4891 Unspecified atrial fibrillation: Secondary | ICD-10-CM | POA: Insufficient documentation

## 2020-01-21 DIAGNOSIS — Z79810 Long term (current) use of selective estrogen receptor modulators (SERMs): Secondary | ICD-10-CM | POA: Insufficient documentation

## 2020-01-21 DIAGNOSIS — Z79899 Other long term (current) drug therapy: Secondary | ICD-10-CM | POA: Insufficient documentation

## 2020-01-21 DIAGNOSIS — Z7901 Long term (current) use of anticoagulants: Secondary | ICD-10-CM | POA: Insufficient documentation

## 2020-01-21 LAB — CBC WITH DIFFERENTIAL/PLATELET
Abs Immature Granulocytes: 0.01 10*3/uL (ref 0.00–0.07)
Basophils Absolute: 0 10*3/uL (ref 0.0–0.1)
Basophils Relative: 1 %
Eosinophils Absolute: 0.2 10*3/uL (ref 0.0–0.5)
Eosinophils Relative: 3 %
HCT: 37.2 % (ref 36.0–46.0)
Hemoglobin: 12.4 g/dL (ref 12.0–15.0)
Immature Granulocytes: 0 %
Lymphocytes Relative: 41 %
Lymphs Abs: 2.2 10*3/uL (ref 0.7–4.0)
MCH: 29.9 pg (ref 26.0–34.0)
MCHC: 33.3 g/dL (ref 30.0–36.0)
MCV: 89.6 fL (ref 80.0–100.0)
Monocytes Absolute: 0.6 10*3/uL (ref 0.1–1.0)
Monocytes Relative: 11 %
Neutro Abs: 2.4 10*3/uL (ref 1.7–7.7)
Neutrophils Relative %: 44 %
Platelets: 213 10*3/uL (ref 150–400)
RBC: 4.15 MIL/uL (ref 3.87–5.11)
RDW: 12.5 % (ref 11.5–15.5)
WBC: 5.5 10*3/uL (ref 4.0–10.5)
nRBC: 0 % (ref 0.0–0.2)

## 2020-01-21 LAB — COMPREHENSIVE METABOLIC PANEL
ALT: 12 U/L (ref 0–44)
AST: 19 U/L (ref 15–41)
Albumin: 3.7 g/dL (ref 3.5–5.0)
Alkaline Phosphatase: 75 U/L (ref 38–126)
Anion gap: 9 (ref 5–15)
BUN: 19 mg/dL (ref 6–20)
CO2: 22 mmol/L (ref 22–32)
Calcium: 9.7 mg/dL (ref 8.9–10.3)
Chloride: 110 mmol/L (ref 98–111)
Creatinine, Ser: 0.75 mg/dL (ref 0.44–1.00)
GFR calc Af Amer: >60 mL/min (ref >60)
GFR calc non Af Amer: >60 mL/min (ref >60)
Glucose, Bld: 105 mg/dL — ABNORMAL HIGH (ref 70–99)
Potassium: 4.7 mmol/L (ref 3.5–5.1)
Sodium: 141 mmol/L (ref 135–145)
Total Bilirubin: 0.5 mg/dL (ref 0.3–1.2)
Total Protein: 7.1 g/dL (ref 6.5–8.1)

## 2020-01-22 ENCOUNTER — Telehealth: Payer: Self-pay

## 2020-01-22 LAB — CANCER ANTIGEN 27.29: CA 27.29: 30.5 U/mL (ref 0.0–38.6)

## 2020-01-22 NOTE — Telephone Encounter (Signed)
Reviewed Dr. Ernestina Penna comments with Ms Giacobbe's son Fnu.  He verbalized understanding.

## 2020-01-22 NOTE — Telephone Encounter (Signed)
-----   Message from [name], MD sent at 01/22/2020 11:15 AM EDT ----- Please call her son and let him know that her tumor marker was normal, which is a good news, thanks   [name]

## 2020-02-29 MED FILL — TAMOXIFEN 20 MG TABLET: 20 | 90 days supply | Qty: 90 | Fill #2

## 2020-05-23 ENCOUNTER — Other Ambulatory Visit: Payer: Self-pay | Admitting: Hematology

## 2020-05-23 DIAGNOSIS — C50911 Malignant neoplasm of unspecified site of right female breast: Secondary | ICD-10-CM

## 2020-05-23 DIAGNOSIS — C50919 Malignant neoplasm of unspecified site of unspecified female breast: Secondary | ICD-10-CM

## 2020-05-26 ENCOUNTER — Other Ambulatory Visit: Payer: Self-pay | Admitting: Nurse Practitioner

## 2020-05-26 MED FILL — TAMOXIFEN 20 MG TABLET: 20 | 90 days supply | Qty: 90 | Fill #0

## 2020-08-26 ENCOUNTER — Other Ambulatory Visit (HOSPITAL_BASED_OUTPATIENT_CLINIC_OR_DEPARTMENT_OTHER): Payer: Self-pay

## 2020-08-28 MED FILL — TAMOXIFEN 20 MG TABLET: 20 | 90 days supply | Qty: 90 | Fill #1

## 2020-12-09 ENCOUNTER — Other Ambulatory Visit (HOSPITAL_COMMUNITY): Payer: Self-pay

## 2020-12-09 MED FILL — Tamoxifen Citrate Tab 20 MG (Base Equivalent): ORAL | 90 days supply | Qty: 90 | Fill #0 | Status: AC

## 2020-12-11 ENCOUNTER — Other Ambulatory Visit (HOSPITAL_COMMUNITY): Payer: Self-pay

## 2021-01-23 ENCOUNTER — Other Ambulatory Visit: Payer: Self-pay

## 2021-01-23 ENCOUNTER — Inpatient Hospital Stay: Payer: Self-pay | Attending: Hematology

## 2021-01-23 ENCOUNTER — Encounter: Payer: Self-pay | Admitting: Hematology

## 2021-01-23 ENCOUNTER — Ambulatory Visit: Payer: No Typology Code available for payment source | Admitting: Hematology

## 2021-01-23 ENCOUNTER — Inpatient Hospital Stay (HOSPITAL_BASED_OUTPATIENT_CLINIC_OR_DEPARTMENT_OTHER): Payer: Self-pay | Admitting: Hematology

## 2021-01-23 VITALS — BP 153/89 | HR 97 | Temp 98.0°F | Resp 18 | Wt 158.2 lb

## 2021-01-23 DIAGNOSIS — C50911 Malignant neoplasm of unspecified site of right female breast: Secondary | ICD-10-CM

## 2021-01-23 DIAGNOSIS — Z170 Estrogen receptor positive status [ER+]: Secondary | ICD-10-CM

## 2021-01-23 DIAGNOSIS — Z853 Personal history of malignant neoplasm of breast: Secondary | ICD-10-CM | POA: Insufficient documentation

## 2021-01-23 DIAGNOSIS — Z791 Long term (current) use of non-steroidal anti-inflammatories (NSAID): Secondary | ICD-10-CM | POA: Insufficient documentation

## 2021-01-23 LAB — COMPREHENSIVE METABOLIC PANEL
ALT: 19 U/L (ref 0–44)
AST: 24 U/L (ref 15–41)
Albumin: 3.7 g/dL (ref 3.5–5.0)
Alkaline Phosphatase: 83 U/L (ref 38–126)
Anion gap: 9 (ref 5–15)
BUN: 10 mg/dL (ref 6–20)
CO2: 25 mmol/L (ref 22–32)
Calcium: 9.1 mg/dL (ref 8.9–10.3)
Chloride: 106 mmol/L (ref 98–111)
Creatinine, Ser: 0.72 mg/dL (ref 0.44–1.00)
GFR, Estimated: >60 mL/min (ref >60)
Glucose, Bld: 102 mg/dL — ABNORMAL HIGH (ref 70–99)
Potassium: 4.1 mmol/L (ref 3.5–5.1)
Sodium: 140 mmol/L (ref 135–145)
Total Bilirubin: 0.5 mg/dL (ref 0.3–1.2)
Total Protein: 7.1 g/dL (ref 6.5–8.1)

## 2021-01-23 LAB — CBC WITH DIFFERENTIAL/PLATELET
Abs Immature Granulocytes: 0.01 10*3/uL (ref 0.00–0.07)
Basophils Absolute: 0.1 10*3/uL (ref 0.0–0.1)
Basophils Relative: 1 %
Eosinophils Absolute: 0.1 10*3/uL (ref 0.0–0.5)
Eosinophils Relative: 2 %
HCT: 34.6 % — ABNORMAL LOW (ref 36.0–46.0)
Hemoglobin: 11.8 g/dL — ABNORMAL LOW (ref 12.0–15.0)
Immature Granulocytes: 0 %
Lymphocytes Relative: 42 %
Lymphs Abs: 3 10*3/uL (ref 0.7–4.0)
MCH: 29.6 pg (ref 26.0–34.0)
MCHC: 34.1 g/dL (ref 30.0–36.0)
MCV: 86.7 fL (ref 80.0–100.0)
Monocytes Absolute: 0.6 10*3/uL (ref 0.1–1.0)
Monocytes Relative: 8 %
Neutro Abs: 3.4 10*3/uL (ref 1.7–7.7)
Neutrophils Relative %: 47 %
Platelets: 219 10*3/uL (ref 150–400)
RBC: 3.99 MIL/uL (ref 3.87–5.11)
RDW: 12.7 % (ref 11.5–15.5)
WBC: 7.2 10*3/uL (ref 4.0–10.5)
nRBC: 0 % (ref 0.0–0.2)

## 2021-01-23 NOTE — Progress Notes (Signed)
Angela [name]   [phone] [phone]) [phone]   Clinic Follow up Note   Patient Care Team: [name], MD as PCP - General (Hematology)  Date of Service:  01/23/2021  CHIEF COMPLAINT: f/u of right breast cancer  SUMMARY OF ONCOLOGIC HISTORY: Oncology History Overview Note  Cancer Staging No matching staging information was found for the patient.     Malignant neoplasm of right female breast [facility])  04/2010 Mammogram   annual mammogram performed in November 2011 and she felt a breast mass.   06/18/2010 Surgery   In 06/18/2010 patient underwent a right modified radical mastectomy with level II axillary clearance.  Her final pathology revealed a invasive ductal carcinoma that was grade 3 measuring 5.3 cm in greatest dimension.  All margins were negative. She was noted to have level lymphovascular invasion.  Patient had 22 lymph nodes removed one was positive for metastatic disease without extracapsular extension.   2012 Initial Diagnosis   Malignant neoplasm of right female breast ([place])  Stage III ( Invasive Ductal Carcinoma, ER+ Her2Neu+)  Right breast s/p mastectomy with ALND, 5.3 cm    2012 -  Chemotherapy   Patient went on to receive adjuvant chemotherapy consisting of 6 cycles of Taxotere Adriamycin and Cytoxan.    2012 -  Radiation Therapy   Adjuvant Radiation due to node positive disease.    2012 -  Anti-estrogen oral therapy   Tamoxifen 20 mg daily since mid 2012. Held from 06/2019-08/2019 due to new onset Afib. Plan to complete at the end of 01/2021.    12/26/2018 Imaging   12/26/18 Left UA Axilla  IMPRESSION: The previously identified reactive lymph node in the left axilla has returned to normal.      CURRENT THERAPY:  Tamoxifen 20 mg daily since mid 2012. Held from 06/2019-08/2019 due to new onset Afib. Plan to complete at the end of 01/2021  INTERVAL HISTORY:  Angela [name] is here for a follow up of breast cancer. She was last seen by  me on 01/21/20. She presents to the clinic accompanied by her son and a Dentist. Her son reports she does not like the tamoxifen-- she experienced hair thinning. He denies hot flashes, leg swelling, abdominal issues, memory issues. She continues to remain active with walking and climbing stairs in their home.   All other systems were reviewed with the patient and are negative.  MEDICAL HISTORY:  Past Medical History:  Diagnosis Date   Breast cancer ([place]) 06/18/2010   stage II   Stomach ulcer 07/04/2012    SURGICAL HISTORY: Past Surgical History:  Procedure Laterality Date   BREAST SURGERY     MASTECTOMY WITH AXILLARY LYMPH NODE DISSECTION Right 06/18/2010   T3N1    I have reviewed the social history and family history with the patient and they are unchanged from previous note.  ALLERGIES:  has No Known Allergies.  MEDICATIONS:  Current Outpatient Medications  Medication Sig Dispense Refill   apixaban (ELIQUIS) 5 MG TABS tablet Take 1 tablet by mouth 2 (two) times daily.     BIOTIN 5000 PO Take 1 tablet by mouth daily.      fish oil-omega-3 fatty acids 1000 MG capsule Take 2 g by mouth daily as needed (nutrition).      lisinopril (ZESTRIL) 5 MG tablet Take 1 tablet (5 mg total) by mouth daily. 30 tablet 1   meloxicam (MOBIC) 15 MG tablet Take 1 tablet (15 mg total) by mouth daily. First line  option. (Patient not taking: Reported on 06/28/2018) 30 tablet 1   metoprolol succinate (TOPROL-XL) 50 MG 24 hr tablet Take by mouth.     NON FORMULARY Take 40 mg by mouth daily as needed (indigestion). Risek 40m     tamoxifen (NOLVADEX) 20 MG tablet Take 1 tablet (20 mg total) by mouth daily. (Patient not taking: Reported on 01/24/2018) 90 tablet 2   tamoxifen (NOLVADEX) 20 MG tablet TAKE 1 TABLET BY MOUTH DAILY 90 tablet 2   No current facility-administered medications for this visit.    PHYSICAL EXAMINATION: ECOG PERFORMANCE STATUS: 0 - Asymptomatic  Vitals:   01/23/21  1134  BP: (!) 153/89  Pulse: 97  Resp: 18  Temp: 98 F (36.7 C)  SpO2: 99%   Wt Readings from Last 3 Encounters:  01/23/21 158 lb 3.2 oz (71.8 kg)  01/21/20 152 lb 14.4 oz (69.4 kg)  01/19/19 165 lb 3.2 oz (74.9 kg)     GENERAL:alert, no distress and comfortable SKIN: skin color, texture, turgor are normal, no rashes or significant lesions EYES: normal, Conjunctiva are pink and non-injected, sclera clear  NECK: supple, thyroid normal size, non-tender, without nodularity LYMPH:  no palpable lymphadenopathy in the cervical, axillary  LUNGS: clear to auscultation and percussion with normal breathing effort HEART: regular rate & rhythm and no murmurs and no lower extremity edema ABDOMEN:abdomen soft, non-tender and normal bowel sounds Musculoskeletal:no cyanosis of digits and no clubbing  NEURO: alert & oriented x 3 with fluent speech, no focal motor/sensory deficits  LABORATORY DATA:  I have reviewed the data as listed CBC Latest Ref Rng & Units 01/23/2021 01/21/2020 01/19/2019  WBC 4.0 - 10.5 K/uL 7.2 5.5 6.2  Hemoglobin 12.0 - 15.0 g/dL 11.8(L) 12.4 12.6  Hematocrit 36.0 - 46.0 % 34.6(L) 37.2 36.7  Platelets 150 - 400 K/uL 219 213 217     CMP Latest Ref Rng & Units 01/23/2021 01/21/2020 01/19/2019  Glucose 70 - 99 mg/dL 102(H) 105(H) 114(H)  BUN 6 - 20 mg/dL '10 19 11  ' Creatinine 0.44 - 1.00 mg/dL 0.72 0.75 0.75  Sodium 135 - 145 mmol/L 140 141 139  Potassium 3.5 - 5.1 mmol/L 4.1 4.7 4.0  Chloride 98 - 111 mmol/L 106 110 106  CO2 22 - 32 mmol/L '25 22 23  ' Calcium 8.9 - 10.3 mg/dL 9.1 9.7 9.3  Total Protein 6.5 - 8.1 g/dL 7.1 7.1 7.1  Total Bilirubin 0.3 - 1.2 mg/dL 0.5 0.5 0.5  Alkaline Phos 38 - 126 U/L 83 75 87  AST 15 - 41 U/L 24 19 33  ALT 0 - 44 U/L 19 12 32      RADIOGRAPHIC STUDIES: I have personally reviewed the radiological images as listed and agreed with the findings in the report. No results found.   ASSESSMENT & PLAN:  Angela Hellicksonis a 55y.o. female  with   1. 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 She is status post right mastectomy with axillary lymph node dissection for a 5.3 cm node-positive disease on 06/18/10. (Pathology reports are scanned into Epic.) Patient subsequently underwent adjuvant chemotherapy consisting of 6 cycles of Taxotere Adriamycin and Cytoxan. She did not receive anti-HER-2 therapy due to availability and financial issue. She also had post-mastectomy Radiation due to node positive disease.  -She has been on adjuvant Tamoxifen 20 mg daily since 2012, notes she started before chemo and RT. She has been tolerating it very well without  noticeable side effects. Plan for 10 years, to complete at end of 01/2021.  -Due to new onset Afib she held Tamoxifen for 2 months in early 2021.  -her most recent left screening mammogram from 11/13/20 was negative. -From a breast cancer standpoint she is clinically doing well. Lab reviewed, her CBC and CMP are within normal limits except Hgb 11.8. Her physical exam was unremarkable. There is no clinical concern for recurrence  -She is now over 10 years since her cancer diagnosis and has completed 10 years of tamoxifen. I am comfortable releasing her to her PCP. We discussed small risk of distant recurrence after 10 years and what to watch. She voiced good understanding    2. Bone health  -2016 bone density scan was normal. DEXA Scan in 07/2017 was normal; T score 0.1. Will repeat in 2023. -I encouraged her to take calcium and vitamin D for bone health. She is on daily multivitamin    3. 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 She lives with her son who helps translate.  -She moved to [place] in late 2020 with her son. She does not want to change oncologist.    4. Recent Onset Afib, s/p ablation  -Onset in 06/2019. She was treated with Ablation on 06/25/19.  -She is currently on  anticoagulation Eliquis. Her son notes she may be taken off if her Afib resolved and stable.  -She will continue to f/u with her cardiologist.    5. Lung lesions  -She had a CT chest in [place] on 12/26/19 which showed multiple small lung lesions. She did have flu/pneumonia in 07/2018 but did not seek medical attention. Recommendation was for repeat CT chest and 3-6 months, but this does seem to have been performed.   6. Gallbladder stone -Her CT chest from 12/2019 in [place] showed 2cm density in the gallbladder. She has no pain over her gallbladder.  -abdomen US on 03/06/20 showed a 1.7 cm mobile shadowing calculus.     Plan -she will complete 10 years tamoxifen this month -F/u PRN -continue f/u with PCP   No problem-specific Assessment & Plan notes found for this encounter.   No orders of the defined types were placed in this encounter.  All questions were answered. The patient knows to call the clinic with any problems, questions or concerns. No barriers to learning was detected. The total time spent in the appointment was 25 minutes.     [name], MD 01/23/2021   I, Wilburn Mylar, am acting as scribe for [name], MD.   I have reviewed the above documentation for accuracy and completeness, and I agree with the above.

## 2021-01-24 LAB — CANCER ANTIGEN 27.29: CA 27.29: 28 U/mL (ref 0.0–38.6)
